# Patient Record
Sex: Male | Born: 1970 | Race: White | Hispanic: No | Smoking: Former smoker
Health system: Southern US, Community
[De-identification: ages and names within clinical notes are randomized; demographics above are authoritative.]

## PROBLEM LIST (undated history)

## (undated) DIAGNOSIS — J449 Chronic obstructive pulmonary disease, unspecified: Secondary | ICD-10-CM

## (undated) DIAGNOSIS — J45909 Unspecified asthma, uncomplicated: Secondary | ICD-10-CM

## (undated) HISTORY — PX: PLEURAL SCARIFICATION: SHX748

## (undated) HISTORY — PX: OTHER SURGICAL HISTORY: SHX169

---

## 2003-04-14 ENCOUNTER — Emergency Department (HOSPITAL_COMMUNITY): Admission: EM | Admit: 2003-04-14 | Discharge: 2003-04-14 | Payer: Self-pay | Admitting: *Deleted

## 2003-04-15 ENCOUNTER — Ambulatory Visit (HOSPITAL_COMMUNITY): Admission: RE | Admit: 2003-04-15 | Discharge: 2003-04-15 | Payer: Self-pay | Admitting: General Surgery

## 2003-04-15 ENCOUNTER — Encounter: Payer: Self-pay | Admitting: General Surgery

## 2003-04-25 ENCOUNTER — Emergency Department (HOSPITAL_COMMUNITY): Admission: EM | Admit: 2003-04-25 | Discharge: 2003-04-25 | Payer: Self-pay | Admitting: Emergency Medicine

## 2003-05-05 ENCOUNTER — Emergency Department (HOSPITAL_COMMUNITY): Admission: EM | Admit: 2003-05-05 | Discharge: 2003-05-05 | Payer: Self-pay | Admitting: Emergency Medicine

## 2005-08-28 ENCOUNTER — Emergency Department (HOSPITAL_COMMUNITY): Admission: EM | Admit: 2005-08-28 | Discharge: 2005-08-28 | Payer: Self-pay | Admitting: Emergency Medicine

## 2019-01-29 ENCOUNTER — Emergency Department (HOSPITAL_COMMUNITY): Payer: Managed Care, Other (non HMO)

## 2019-01-29 ENCOUNTER — Encounter (HOSPITAL_COMMUNITY): Payer: Self-pay | Admitting: Student

## 2019-01-29 ENCOUNTER — Emergency Department (HOSPITAL_COMMUNITY)
Admission: EM | Admit: 2019-01-29 | Discharge: 2019-01-29 | Disposition: A | Discharge status: Home / Self Care | Payer: Managed Care, Other (non HMO) | Attending: Emergency Medicine | Admitting: Emergency Medicine

## 2019-01-29 ENCOUNTER — Other Ambulatory Visit: Payer: Self-pay

## 2019-01-29 DIAGNOSIS — J449 Chronic obstructive pulmonary disease, unspecified: Secondary | ICD-10-CM | POA: Insufficient documentation

## 2019-01-29 DIAGNOSIS — R1031 Right lower quadrant pain: Secondary | ICD-10-CM | POA: Diagnosis present

## 2019-01-29 DIAGNOSIS — N200 Calculus of kidney: Secondary | ICD-10-CM | POA: Insufficient documentation

## 2019-01-29 LAB — URINALYSIS, ROUTINE W REFLEX MICROSCOPIC
Bilirubin Urine: NEGATIVE
Glucose, UA: NEGATIVE mg/dL
Ketones, ur: NEGATIVE mg/dL
Leukocytes,Ua: NEGATIVE
Nitrite: NEGATIVE
Protein, ur: NEGATIVE mg/dL
Specific Gravity, Urine: 1.005 (ref 1.005–1.030)
pH: 8 (ref 5.0–8.0)

## 2019-01-29 LAB — CBC
HCT: 39.5 % (ref 39.0–52.0)
Hemoglobin: 12.5 g/dL — ABNORMAL LOW (ref 13.0–17.0)
MCH: 26.5 pg (ref 26.0–34.0)
MCHC: 31.6 g/dL (ref 30.0–36.0)
MCV: 83.9 fL (ref 80.0–100.0)
Platelets: 191 10*3/uL (ref 150–400)
RBC: 4.71 MIL/uL (ref 4.22–5.81)
RDW: 14.6 % (ref 11.5–15.5)
WBC: 5.1 10*3/uL (ref 4.0–10.5)
nRBC: 0 % (ref 0.0–0.2)

## 2019-01-29 LAB — COMPREHENSIVE METABOLIC PANEL
ALT: 15 U/L (ref 0–44)
AST: 17 U/L (ref 15–41)
Albumin: 4.4 g/dL (ref 3.5–5.0)
Alkaline Phosphatase: 112 U/L (ref 38–126)
Anion gap: 5 (ref 5–15)
BUN: 12 mg/dL (ref 6–20)
CO2: 24 mmol/L (ref 22–32)
Calcium: 9.3 mg/dL (ref 8.9–10.3)
Chloride: 110 mmol/L (ref 98–111)
Creatinine, Ser: 1.09 mg/dL (ref 0.61–1.24)
GFR calc Af Amer: >60 mL/min (ref >60)
GFR calc non Af Amer: >60 mL/min (ref >60)
Glucose, Bld: 98 mg/dL (ref 70–99)
Potassium: 3.5 mmol/L (ref 3.5–5.1)
Sodium: 139 mmol/L (ref 135–145)
Total Bilirubin: 0.5 mg/dL (ref 0.3–1.2)
Total Protein: 7.3 g/dL (ref 6.5–8.1)

## 2019-01-29 MED ORDER — TAMSULOSIN HCL 0.4 MG PO CAPS: 0.4000 mg | ORAL_CAPSULE | Freq: Every day | ORAL | 0 refills | Status: DC

## 2019-01-29 MED ORDER — ONDANSETRON HCL 4 MG/2ML IJ SOLN
4.0000 mg | Freq: Once | INTRAMUSCULAR | Status: AC
  Administered 2019-01-29: 10:00:00 4 mg via INTRAVENOUS
  Filled 2019-01-29: qty 2

## 2019-01-29 MED ORDER — SODIUM CHLORIDE 0.9 % IV BOLUS
500.0000 mL | Freq: Once | INTRAVENOUS | Status: AC
  Administered 2019-01-29: 10:00:00 500 mL via INTRAVENOUS

## 2019-01-29 MED ORDER — ONDANSETRON 4 MG PO TBDP: 4.0000 mg | ORAL_TABLET | Freq: Three times a day (TID) | ORAL | 0 refills | Status: DC | PRN

## 2019-01-29 MED ORDER — MORPHINE SULFATE (PF) 4 MG/ML IV SOLN
4.0000 mg | Freq: Once | INTRAVENOUS | Status: AC
  Administered 2019-01-29: 10:00:00 4 mg via INTRAVENOUS
  Filled 2019-01-29: qty 1

## 2019-01-29 MED ORDER — IOHEXOL 300 MG/ML  SOLN
100.0000 mL | Freq: Once | INTRAMUSCULAR | Status: AC | PRN
  Administered 2019-01-29: 100 mL via INTRAVENOUS

## 2019-01-29 MED ORDER — CEPHALEXIN 500 MG PO CAPS: 500.0000 mg | ORAL_CAPSULE | Freq: Four times a day (QID) | ORAL | 0 refills | Status: DC

## 2019-01-29 MED ORDER — CEPHALEXIN 500 MG PO CAPS
500.0000 mg | ORAL_CAPSULE | Freq: Once | ORAL | Status: AC
  Administered 2019-01-29: 500 mg via ORAL
  Filled 2019-01-29: qty 1

## 2019-01-29 MED ORDER — OXYCODONE-ACETAMINOPHEN 5-325 MG PO TABS: 1.0000 | ORAL_TABLET | Freq: Four times a day (QID) | ORAL | 0 refills | Status: DC | PRN

## 2019-01-29 MED ORDER — SODIUM CHLORIDE (PF) 0.9 % IJ SOLN
INTRAMUSCULAR | Status: AC
  Filled 2019-01-29: qty 50

## 2019-01-29 MED ORDER — KETOROLAC TROMETHAMINE 15 MG/ML IJ SOLN
15.0000 mg | Freq: Once | INTRAMUSCULAR | Status: AC
  Administered 2019-01-29: 15 mg via INTRAVENOUS
  Filled 2019-01-29: qty 1

## 2019-01-29 MED ORDER — IBUPROFEN 800 MG PO TABS: 800.0000 mg | ORAL_TABLET | Freq: Three times a day (TID) | ORAL | 0 refills | Status: AC

## 2019-01-29 NOTE — ED Notes (Signed)
Bed: QB34 Expected date:  Expected time:  Means of arrival:  Comments: EMS 47yo abd pain

## 2019-01-29 NOTE — ED Triage Notes (Signed)
Patient BIB EMS from work with complaints of RLQ abdominal pain starting this morning. Patient has history of kidney stones that have been small and easy to pass. Patient reports this pain started in his RLQ abdomen and has moved down toward his bladder. Patient has history of hypotension, and takes medications to keep his BP up.  EMS VS: HR 73, RR22, SPO2 98%, 97.43F, 138 palp.

## 2019-01-29 NOTE — ED Provider Notes (Signed)
[facility]-EMERGENCY DEPT Provider Note   CSN: [id number] Arrival date & time: 01/29/19  0906  History   Chief Complaint Chief Complaint  Patient presents with   Abdominal Pain    HPI Bobby Brown is a 48 y.o. male with a hx of nephrolithiasis, COPD, & prior R lower lobectomy who presents to the ED via EMS with complaints of abdominal pain that began @ 0600 this AM. Patient describes the pain as a waxing & waning dull sensation to the RLQ of the abdomen radiating into the bladder area. Current pain 7/10 in severity, no alleviating/aggravating factors. NO intervention PTA. Reports associated nausea & urge to urinate but inability to do so. Last able to urinate last night. Denies fever, chills, emesis, testicular pain/swelling, dysuria, hematuria, diarrhea, or melena. Last BM Was last night, had to strain some. No prior abdominal surgeries. Hx of nephrolithiasis but typically has flank pain, this does not necessarily feel the same.      HPI  History reviewed. No pertinent past medical history.  There are no active problems to display for this patient.   History reviewed. No pertinent surgical history.    Home Medications    Prior to Admission medications   Not on File    Family History History reviewed. No pertinent family history.  Social History Social History   Tobacco Use   Smoking status: Not on file  Substance Use Topics   Alcohol use: Not on file   Drug use: Not on file     Allergies   Patient has no known allergies.   Review of Systems Review of Systems  Constitutional: Negative for chills and fever.  Respiratory: Negative for shortness of breath.   Cardiovascular: Negative for chest pain.  Gastrointestinal: Positive for abdominal pain and nausea. Negative for anal bleeding, blood in stool, diarrhea and vomiting.  Genitourinary: Positive for difficulty urinating and urgency. Negative for dysuria, flank pain, hematuria, scrotal  swelling and testicular pain.  All other systems reviewed and are negative.    Physical Exam Updated Vital Signs BP 122/83    Pulse (!) 44    Temp 98 F (36.7 C) (Oral)    Resp 18    SpO2 98%    Physical Exam Vitals signs and nursing note reviewed.  Constitutional:      General: He is in acute distress (mild, appears uncomfortable).     Appearance: He is well-developed. He is not toxic-appearing.  HENT:     Head: Normocephalic and atraumatic.  Eyes:     General:        Right eye: No discharge.        Left eye: No discharge.     Conjunctiva/sclera: Conjunctivae normal.  Neck:     Musculoskeletal: Neck supple.  Cardiovascular:     Rate and Rhythm: Normal rate and regular rhythm.  Pulmonary:     Effort: Pulmonary effort is normal. No respiratory distress.     Breath sounds: Normal breath sounds. No wheezing, rhonchi or rales.  Abdominal:     General: There is no distension.     Palpations: Abdomen is soft.     Tenderness: There is abdominal tenderness in the right lower quadrant. There is no right CVA tenderness or left CVA tenderness.  Skin:    General: Skin is warm and dry.     Findings: No rash.  Neurological:     Mental Status: He is alert.     Comments: Clear speech.   Psychiatric:  Behavior: Behavior normal.    ED Treatments / Results  Labs (all labs ordered are listed, but only abnormal results are displayed) Labs Reviewed  CBC - Abnormal; Notable for the following components:      Result Value   Hemoglobin 12.5 (*)    All other components within normal limits  URINALYSIS, ROUTINE W REFLEX MICROSCOPIC - Abnormal; Notable for the following components:   Hgb urine dipstick LARGE (*)    Bacteria, UA MANY (*)    All other components within normal limits  URINE CULTURE  COMPREHENSIVE METABOLIC PANEL    EKG None  Radiology Ct Abdomen Pelvis W Contrast  Result Date: 01/29/2019 CLINICAL DATA:  Right lower quadrant region pain EXAM: CT ABDOMEN AND  PELVIS WITH CONTRAST TECHNIQUE: Multidetector CT imaging of the abdomen and pelvis was performed using the standard protocol following bolus administration of intravenous contrast. CONTRAST:  100mL OMNIPAQUE IOHEXOL 300 MG/ML  SOLN COMPARISON:  None. FINDINGS: Lower chest: There is scarring in the lung bases bilaterally, slightly more on the right than on the left. No lung base edema or consolidation evident. Hepatobiliary: There is evident hepatic steatosis. No focal liver lesions are demonstrable. The gallbladder wall is not appreciably thickened. There is no biliary duct dilatation. Pancreas: There is no evident pancreatic mass or inflammatory focus. Spleen: No splenic lesions are evident. Adrenals/Urinary Tract: Adrenals bilaterally appear unremarkable. There is no appreciable renal mass on either side. There is slight hydronephrosis on the right. There is no appreciable hydronephrosis on the left. No intrarenal calculi identified on either side. There is mild soft tissue stranding along the proximal right ureter with mild enhancement of the wall of the proximal right ureter. There is a 4 x 3 mm calculus at the right ureterovesical junction. No other ureteral calculi are evident on either side. Urinary bladder is midline with wall thickness within normal limits. Stomach/Bowel: There is no appreciable bowel wall or mesenteric thickening. There is no evident bowel obstruction. Terminal ileum appears unremarkable. There is no appreciable free air or portal venous air. Vascular/Lymphatic: There is no abdominal aortic aneurysm. There is minimal atherosclerotic calcification in the aorta. There is no evident adenopathy in the abdomen or pelvis. Reproductive: There are prostatic calculi. Prostate and seminal vesicles are normal in size and contour. No evident pelvic mass. Other: Appendix appears normal. There is no abscess or ascites in the abdomen or pelvis. Musculoskeletal: There are no blastic or lytic bone lesions.  There is no intramuscular or abdominal wall lesion. IMPRESSION: 1. 4 x 3 mm calculus at the right ureterovesical junction. There is slight hydronephrosis on the right. There is mild inflammatory change involving the proximal right ureter. 2. Appendix appears normal. No bowel obstruction. No abscess in the abdomen or pelvis. 3.  There are several prostatic calculi. 4.  Scarring in the lung bases. 5.  Hepatic steatosis present. Electronically Signed   By: Bretta BangWilliam  Woodruff III M.D.   On: 01/29/2019 11:31    Procedures Procedures (including critical care time)  Medications Ordered in ED Medications  morphine 4 MG/ML injection 4 mg (has no administration in time range)  ondansetron (ZOFRAN) injection 4 mg (has no administration in time range)  sodium chloride 0.9 % bolus 500 mL (has no administration in time range)     Initial Impression / Assessment and Plan / ED Course  I have reviewed the triage vital signs and the nursing notes.  Pertinent labs & imaging results that were available during my care of the patient  were reviewed by me and considered in my medical decision making (see chart for details).    Patient presents to the ED with complaints of RLQ abdominal pain. Patient nontoxic appearing, appears mildly uncomfortable, vitals notable for intermittent bradycardia, hypertension- doubt HTN emergency. afebrile. On exam patient is tender RLQ of the abdomen, no CVA tenderness. DDX: nephrolithiasis, pyelonephritis/UTI, cholecystitis, pancreatitis, bowel obstruction/perforation, appendicitis, dissection, feel nephrolithiasis is most likely at this time, but given his RLQ tenderness and that this does not feel like his prior stones will evaluate w/ CT abdomen/pelvis w/ contrast, additionally w/ labs. Analgesics, anti-emetics, and fluids ordered. He reports some difficulty urinating- bladder scan w/ 135-145 of mL, ultimately was able to urinate on his own.   No leukocytosis. Mild anemia- PCP recheck,  none on record for comparison. No significant electrolyte derangements. LFTs WNL.  CT w/ 4 x 3 mm calculus @ the R ureterovesical junction w/ slight hydro & mild inflammatory changes to proximal ureter to R side. Appendix is normal. No abscess. No obstruction. Incidental findings as above. Renal function preserved. Urinalysis with many bacteria-- will discuss w/ urology.   Discussed w/ urologist Dr. Sande Brothers- recommends culture & Keflex for 7 days.   Patient tolerating PO in the ER with pain well controlled. Will discharge home with Keflex, Flomax, Zofran, NSAID, and Percocet with urology follow up. [place] Controlled Substance reporting System queried. I discussed results, treatment plan, need for urology follow-up, and return precautions with the patient. Provided opportunity for questions, patient confirmed understanding and is in agreement with plan.   Findings and plan of care discussed with supervising physician Dr. Rubin Payor who is in agreement.   Final Clinical Impressions(s) / ED Diagnoses   Final diagnoses:  Kidney stone    ED Discharge Orders         Ordered    ibuprofen (ADVIL) 800 MG tablet  3 times daily     01/29/19 1403    oxyCODONE-acetaminophen (PERCOCET/ROXICET) 5-325 MG tablet  Every 6 hours PRN     01/29/19 1403    ondansetron (ZOFRAN ODT) 4 MG disintegrating tablet  Every 8 hours PRN     01/29/19 1403    tamsulosin (FLOMAX) 0.4 MG CAPS capsule  Daily after supper     01/29/19 1403    cephALEXin (KEFLEX) 500 MG capsule  4 times daily     01/29/19 1403           Clarivel Callaway, Pleas Koch, PA-C 01/29/19 1416    Benjiman Core, MD 01/30/19 1512

## 2019-01-29 NOTE — ED Notes (Signed)
Pt. Bladder scan was measured x2 being that the first one was reading at and second one was . Nurse aware.

## 2019-01-29 NOTE — ED Notes (Signed)
ED PA at bedside

## 2019-01-29 NOTE — Discharge Instructions (Addendum)
You were seen in the emergency department and found to have a kidney stone.  We are sending you home with multiple medications to assist with passing the stone:   -Flomax-this is a medication to help pass the stone, it allows urine to exit the body more freely.  Please take this once daily with a meal.  -Ibuprofen 800 mg-this is a medication that will help with pain as well as passing the stone.  Please take this every 8 hours.  Take this with food as it can cause stomach upset and at worst stomach bleeding.  Do not take other NSAIDs such as Motrin, Aleve, Advil, Mobic, or Naproxen with this medicine as they are similar and would propagate any potential side effects.   -Percocet-this is a narcotic/controlled substance medication that has potential addicting qualities.  We recommend that you take 1-2 tablets every 6 hours as needed for severe pain.  Do not drive or operate heavy machinery when taking this medicine as it can be sedating. Do not drink alcohol or take other sedating medications when taking this medicine for safety reasons.  Keep this out of reach of small children.  Please be aware this medicine has Tylenol in it (325 mg/tab) do not exceed the maximum dose of Tylenol in a day per over the counter recommendations should you decide to supplement with Tylenol over the counter.   -Zofran-this is an antinausea medication, you may take this every 8 hours as needed for nausea and vomiting, please allow the tablet to dissolve underneath of your tongue.   - Keflex-this is an antibiotic to treat possible infection as there was bacteria in your urine.  We have prescribed you new medication(s) today. Discuss the medications prescribed today with your pharmacist as they can have adverse effects and interactions with your other medicines including over the counter and prescribed medications. Seek medical evaluation if you start to experience new or abnormal symptoms after taking one of these medicines,  seek care immediately if you start to experience difficulty breathing, feeling of your throat closing, facial swelling, or rash as these could be indications of a more serious allergic reaction  Your CT scan showed some fatty liver changes and that you have some stones in the prostate. Discussed the liver changes with primary care and the prostate changes with urology please.   Please follow-up with the urology group provided in your discharge instructions within 3 to 5 days.  Return to the ER for new or worsening symptoms including but not limited to worsening pain not controlled by these medicines, inability to keep fluids down, fever, or any other concerns that you may have.

## 2019-01-31 LAB — URINE CULTURE: Culture: NO GROWTH

## 2019-03-16 ENCOUNTER — Encounter (HOSPITAL_COMMUNITY): Payer: Self-pay | Admitting: Emergency Medicine

## 2019-03-16 ENCOUNTER — Emergency Department (HOSPITAL_COMMUNITY)
Admission: EM | Admit: 2019-03-16 | Discharge: 2019-03-16 | Disposition: A | Discharge status: Home / Self Care | Payer: Managed Care, Other (non HMO) | Attending: Emergency Medicine | Admitting: Emergency Medicine

## 2019-03-16 ENCOUNTER — Emergency Department (HOSPITAL_COMMUNITY): Payer: Managed Care, Other (non HMO)

## 2019-03-16 ENCOUNTER — Other Ambulatory Visit: Payer: Self-pay

## 2019-03-16 DIAGNOSIS — Y929 Unspecified place or not applicable: Secondary | ICD-10-CM | POA: Diagnosis not present

## 2019-03-16 DIAGNOSIS — Y999 Unspecified external cause status: Secondary | ICD-10-CM | POA: Insufficient documentation

## 2019-03-16 DIAGNOSIS — W25XXXA Contact with sharp glass, initial encounter: Secondary | ICD-10-CM | POA: Insufficient documentation

## 2019-03-16 DIAGNOSIS — Z79899 Other long term (current) drug therapy: Secondary | ICD-10-CM | POA: Diagnosis not present

## 2019-03-16 DIAGNOSIS — Z20828 Contact with and (suspected) exposure to other viral communicable diseases: Secondary | ICD-10-CM | POA: Diagnosis not present

## 2019-03-16 DIAGNOSIS — F17290 Nicotine dependence, other tobacco product, uncomplicated: Secondary | ICD-10-CM | POA: Insufficient documentation

## 2019-03-16 DIAGNOSIS — Y9389 Activity, other specified: Secondary | ICD-10-CM | POA: Diagnosis not present

## 2019-03-16 DIAGNOSIS — S51812A Laceration without foreign body of left forearm, initial encounter: Secondary | ICD-10-CM

## 2019-03-16 DIAGNOSIS — S56922A Laceration of unspecified muscles, fascia and tendons at forearm level, left arm, initial encounter: Secondary | ICD-10-CM | POA: Diagnosis not present

## 2019-03-16 DIAGNOSIS — J45909 Unspecified asthma, uncomplicated: Secondary | ICD-10-CM | POA: Insufficient documentation

## 2019-03-16 DIAGNOSIS — S59912A Unspecified injury of left forearm, initial encounter: Secondary | ICD-10-CM | POA: Diagnosis present

## 2019-03-16 HISTORY — DX: Unspecified asthma, uncomplicated: J45.909

## 2019-03-16 HISTORY — DX: Chronic obstructive pulmonary disease, unspecified: J44.9

## 2019-03-16 LAB — SARS CORONAVIRUS 2 BY RT PCR (HOSPITAL ORDER, PERFORMED IN ~~LOC~~ HOSPITAL LAB): SARS Coronavirus 2: NEGATIVE

## 2019-03-16 MED ORDER — HYDROCODONE-ACETAMINOPHEN 5-325 MG PO TABS
1.0000 | ORAL_TABLET | Freq: Once | ORAL | Status: AC
  Administered 2019-03-16: 1 via ORAL
  Filled 2019-03-16: qty 1

## 2019-03-16 MED ORDER — LIDOCAINE-EPINEPHRINE (PF) 2 %-1:200000 IJ SOLN
10.0000 mL | Freq: Once | INTRAMUSCULAR | Status: AC
  Administered 2019-03-16: 10 mL
  Filled 2019-03-16: qty 20

## 2019-03-16 MED ORDER — MORPHINE SULFATE (PF) 4 MG/ML IV SOLN
4.0000 mg | Freq: Once | INTRAVENOUS | Status: AC
  Administered 2019-03-16: 4 mg via INTRAVENOUS
  Filled 2019-03-16: qty 1

## 2019-03-16 NOTE — ED Notes (Signed)
DSD applied.  Ortho tech in to apply short arm splint

## 2019-03-16 NOTE — ED Notes (Signed)
Pt returned from xray

## 2019-03-16 NOTE — ED Triage Notes (Addendum)
Pt to ED via [facility] after reported working on a car and a broken window fell on his left wrist.  EMS st's pt has avulsion type laceration to left wrist.  Bandage in place, bleeding controlled at this time.  Pt was given Fentanyl 279mcg IV PTA

## 2019-03-16 NOTE — ED Provider Notes (Signed)
[facility] EMERGENCY DEPARTMENT Provider Note   CSN: [id number] Arrival date & time: 03/16/19  Bobby Brown     History   Chief Complaint Chief Complaint  Patient presents with  . Laceration    HPI Bobby Brown is a 48 y.o. male.     The history is provided by the patient.  Laceration Location:  Shoulder/arm Shoulder/arm laceration location:  L forearm Length:  5cm Depth:  Through underlying tissue Quality: jagged   Bleeding: venous and controlled with pressure   Time since incident:  1 hour Laceration mechanism:  Broken glass Pain details:    Quality:  Throbbing, squeezing and shooting   Severity:  Moderate   Timing:  Constant   Progression:  Unchanged Foreign body present:  Unable to specify Relieved by: being still. Worsened by:  Movement Ineffective treatments:  Certain positions Associated symptoms: no numbness and no redness     Past Medical History:  Diagnosis Date  . Asthma   . COPD (chronic obstructive pulmonary disease) (HCC)     There are no active problems to display for this patient.   Past Surgical History:  Procedure Laterality Date  . PLEURAL SCARIFICATION    . right lobectomy          Home Medications    Prior to Admission medications   Medication Sig Start Date End Date Taking? Authorizing Provider  buPROPion (WELLBUTRIN SR) 150 MG 12 hr tablet Take 150 mg by mouth at bedtime.  11/19/18   [provider]  busPIRone (BUSPAR) 5 MG tablet Take 5 mg by mouth at bedtime.  11/30/18   [provider]  cephALEXin (KEFLEX) 500 MG capsule Take 1 capsule (500 mg total) by mouth 4 (four) times daily. 01/29/19   Petrucelli, Samantha R, PA-C  dicyclomine (BENTYL) 20 MG tablet Take 20 mg by mouth at bedtime.     [provider]  fludrocortisone (FLORINEF) 0.1 MG tablet Take 0.1 mg by mouth at bedtime.  11/16/18   [provider]  ibuprofen (ADVIL) 800 MG tablet Take 1 tablet (800 mg total) by mouth 3  (three) times daily. 01/29/19   Petrucelli, Samantha R, PA-C  ondansetron (ZOFRAN ODT) 4 MG disintegrating tablet Take 1 tablet (4 mg total) by mouth every 8 (eight) hours as needed for nausea or vomiting. 01/29/19   Petrucelli, Samantha R, PA-C  oxyCODONE-acetaminophen (PERCOCET/ROXICET) 5-325 MG tablet Take 1-2 tablets by mouth every 6 (six) hours as needed for severe pain. 01/29/19   Petrucelli, Samantha R, PA-C  oxymetazoline (AFRIN) 0.05 % nasal spray Place 2 sprays into both nostrils as needed for congestion.     [provider]  SUMAtriptan (IMITREX) 100 MG tablet Take 100 mg by mouth every 2 (two) hours as needed for migraine or headache. Maximum dose 200mg  in 24 hours. 11/08/17   [provider]  tamsulosin (FLOMAX) 0.4 MG CAPS capsule Take 1 capsule (0.4 mg total) by mouth daily after supper. 01/29/19   Petrucelli, Samantha R, PA-C  traZODone (DESYREL) 50 MG tablet Take 50 mg by mouth at bedtime. 11/19/18   [provider]    Family History No family history on file.  Social History Social History   Tobacco Use  . Smoking status: Former Research scientist (life sciences)  . Smokeless tobacco: Current User  Substance Use Topics  . Alcohol use: Yes    Comment: rarely  . Drug use: Never     Allergies   Aspirin   Review of Systems Review of Systems  All other systems reviewed and are negative.    Physical Exam Updated Vital Signs BP (!) 136/94 (BP Location: Right Arm)   Pulse 80   Temp 98.3 F (36.8 C) (Oral)   Resp 16   Ht 5\' 9"  (1.753 m)   Wt 64.4 kg   SpO2 96%   BMI 20.97 kg/m   Physical Exam Constitutional:      General: He is not in acute distress.    Appearance: Normal appearance. He is normal weight.  HENT:     Head: Normocephalic.  Eyes:     Pupils: Pupils are equal, round, and reactive to light.  Cardiovascular:     Rate and Rhythm: Normal rate.  Pulmonary:     Effort: Pulmonary effort is normal.  Musculoskeletal:        General: Tenderness and signs  of injury present.       Arms:  Skin:    General: Skin is warm and dry.     Capillary Refill: Capillary refill takes less than 2 seconds.  Neurological:     Mental Status: He is alert.     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        Behavior: Behavior normal.        Thought Content: Thought content normal.      ED Treatments / Results  Labs (all labs ordered are listed, but only abnormal results are displayed) Labs Reviewed - No data to display  EKG None  Radiology Dg Forearm Left  Result Date: 03/16/2019 CLINICAL DATA:  48 year old male with laceration of the left forearm. EXAM: LEFT FOREARM - 2 VIEW COMPARISON:  Left wrist radiograph dated 11/02/2013 FINDINGS: There is no acute fracture or dislocation. The bones are well mineralized. Degenerative changes of the scaphotrapezoid. Laceration of the volar aspect of the distal forearm. No radiopaque foreign object. IMPRESSION: 1. No acute/traumatic osseous pathology. 2. No radiopaque foreign body. Electronically Signed   By: Elgie CollardArash  Radparvar M.D.   On: 03/16/2019 20:01    Procedures Procedures (including critical care time)  LACERATION REPAIR Performed by: [employer] RxWhitney Keyonia Gluth Authorized by: Gwyneth SproutWhitney Pamula Luther Consent: Verbal consent obtained. Risks and benefits: risks, benefits and alternatives were discussed Consent given by: patient Patient identity confirmed: provided demographic data Prepped and Draped in normal sterile fashion Wound explored  Laceration Location: left forearm  Laceration Length: 5cm  No Foreign Bodies seen or palpated  Anesthesia: local infiltration  Local anesthetic: lidocaine 1% with epinephrine  Anesthetic total: 4 ml  Irrigation method: syringe Amount of cleaning: standard  Skin closure: 4.0 prolene  Number of sutures: 5  Technique: simple interrupted  Patient tolerance: Patient  tolerated the procedure well with no immediate complications.   Medications Ordered in ED Medications  morphine 4 MG/ML injection 4 mg (has no administration in time range)     Initial Impression / Assessment and Plan / ED Course  I have reviewed the triage vital signs and the nursing notes.  Pertinent labs & imaging results that were available during my care of the patient were reviewed by me and considered in my medical decision making (see chart for details).        Patient is a right-handed 48 year old male presenting today with a laceration over the left wrist with visible signs of tendon injury.  Patient has weak wrist flexion and finger extension but normal sensation.  No arterial injury.  He was cut by broken glass from a car door while he was changing it and the door slipped it cut him.  X-ray to evaluate for retained foreign body.  Will discuss with hand surgery due to multiple tendon injury based on exam suspect palmaris longus, flexor carpi radialis and possible an additional deeper tendon.  Tetanus is UTD.  8:30 PM There is no glass in the wound and x-ray is otherwise negative.  Spoke with Dr. Izora Ribasoley who recommended closing the wound loosely and he will plan on doing surgery tomorrow morning.  Patient was splinted and given pain control.  He was instructed to not eat or drink anything after midnight.  Final Clinical Impressions(s) / ED Diagnoses   Final diagnoses:  Forearm laceration involving tendon, left, initial encounter    ED Discharge Orders    None       Gwyneth SproutPlunkett, Calib Wadhwa, MD 03/16/19 2125

## 2019-03-16 NOTE — Progress Notes (Signed)
Orthopedic Tech Progress Note Patient Details:  Bobby Brown May 23, 1971 [id number]  Ortho Devices Type of Ortho Device: Arm sling, Short arm splint Ortho Device/Splint Location: applied dorsal splint lue tip of fingers to mid forearm at drs request. Ortho Device/Splint Interventions: Ordered, Application, Adjustment   Post Interventions Patient Tolerated: Well Instructions Provided: Care of device, Adjustment of device   Karolee Stamps 03/16/2019, 10:44 PM

## 2019-03-16 NOTE — Discharge Instructions (Signed)
You have a large laceration of your left wrist with tendon injury today.  Leave the splint and dressing in place until you see the hand surgeon Dr. Lenon Curt tomorrow.  You need to call his office at [phone] and they will tell you where you need to go to have surgery.  Try to elevate your hand tonight.

## 2019-03-16 NOTE — ED Notes (Signed)
Pt to xray at this time.

## 2019-09-28 ENCOUNTER — Other Ambulatory Visit: Payer: Self-pay

## 2019-09-28 ENCOUNTER — Emergency Department (HOSPITAL_COMMUNITY): Payer: Managed Care, Other (non HMO)

## 2019-09-28 ENCOUNTER — Emergency Department (HOSPITAL_COMMUNITY)
Admission: EM | Admit: 2019-09-28 | Discharge: 2019-09-28 | Disposition: A | Discharge status: Home / Self Care | Payer: Managed Care, Other (non HMO) | Attending: Emergency Medicine | Admitting: Emergency Medicine

## 2019-09-28 ENCOUNTER — Encounter (HOSPITAL_COMMUNITY): Payer: Self-pay

## 2019-09-28 DIAGNOSIS — Z79899 Other long term (current) drug therapy: Secondary | ICD-10-CM | POA: Insufficient documentation

## 2019-09-28 DIAGNOSIS — J449 Chronic obstructive pulmonary disease, unspecified: Secondary | ICD-10-CM | POA: Insufficient documentation

## 2019-09-28 DIAGNOSIS — R0602 Shortness of breath: Secondary | ICD-10-CM | POA: Insufficient documentation

## 2019-09-28 DIAGNOSIS — R0781 Pleurodynia: Secondary | ICD-10-CM | POA: Diagnosis not present

## 2019-09-28 DIAGNOSIS — F17228 Nicotine dependence, chewing tobacco, with other nicotine-induced disorders: Secondary | ICD-10-CM | POA: Diagnosis not present

## 2019-09-28 DIAGNOSIS — M25512 Pain in left shoulder: Secondary | ICD-10-CM | POA: Diagnosis not present

## 2019-09-28 DIAGNOSIS — R079 Chest pain, unspecified: Secondary | ICD-10-CM | POA: Diagnosis present

## 2019-09-28 MED ORDER — IBUPROFEN 200 MG PO TABS
600.0000 mg | ORAL_TABLET | Freq: Once | ORAL | Status: AC
  Administered 2019-09-28: 600 mg via ORAL
  Filled 2019-09-28: qty 3

## 2019-09-28 MED ORDER — OXYCODONE-ACETAMINOPHEN 5-325 MG PO TABS
1.0000 | ORAL_TABLET | Freq: Once | ORAL | Status: AC
  Administered 2019-09-28: 1 via ORAL
  Filled 2019-09-28: qty 1

## 2019-09-28 NOTE — ED Triage Notes (Signed)
Pt BIB EMS from home, pt lives with girlfriend. Pt c/o SOB, left shoulder pain starting at 0800 today and getting progressively worse. Pt has hx of collapsed lung, anxiety. EMS reports left side diminished lung sounds.   146/90 64 HR 100% RA 18 Resp 97.9 temp

## 2019-09-28 NOTE — ED Notes (Signed)
An After Visit Summary was printed and given to the patient. Discharge instructions given and no further questions at this time. Pt denies SOB at this time.  

## 2019-09-28 NOTE — ED Provider Notes (Signed)
[facility] DEPT Provider Note   CSN: [id number] Arrival date & time: 09/28/19  1930     History Chief Complaint  Patient presents with  . Shortness of Breath  . Left Shoulder Pain    Bobby Brown is a 49 y.o. male.  HPI   49 year old male with chest pain.  Patient reports sharp left-sided chest pain and towards his left scapula.  Onset around 8:00 today.  Pain is worse with inspiration and certain movements.  Mild shortness of breath.  No coughing.  No fevers or chills.  No unusual leg pain or swelling.  He reports a past history of spontaneous pneumothoraces with prior blebectomy and pleurodesis.  Past Medical History:  Diagnosis Date  . Asthma   . COPD (chronic obstructive pulmonary disease) (HCC)    There are no problems to display for this patient.  Past Surgical History:  Procedure Laterality Date  . PLEURAL SCARIFICATION    . right lobectomy       History reviewed. No pertinent family history.  Social History   Tobacco Use  . Smoking status: Former Research scientist (life sciences)  . Smokeless tobacco: Current User  Substance Use Topics  . Alcohol use: Yes    Comment: rarely  . Drug use: Never   Home Medications Prior to Admission medications   Medication Sig Start Date End Date Taking? Authorizing Provider  buPROPion (WELLBUTRIN SR) 150 MG 12 hr tablet Take 150 mg by mouth at bedtime.  11/19/18  Yes [provider]  busPIRone (BUSPAR) 5 MG tablet Take 5 mg by mouth at bedtime.  11/30/18  Yes [provider]  dicyclomine (BENTYL) 20 MG tablet Take 20 mg by mouth at bedtime.    Yes [provider]  ibuprofen (ADVIL) 800 MG tablet Take 1 tablet (800 mg total) by mouth 3 (three) times daily. Patient taking differently: Take 800 mg by mouth every 6 (six) hours as needed for fever or moderate pain.  01/29/19  Yes Petrucelli, Samantha R, PA-C  oxymetazoline (AFRIN) 0.05 % nasal spray Place 2 sprays into both nostrils as needed for  congestion.    Yes [provider]  SUMAtriptan (IMITREX) 100 MG tablet Take 100 mg by mouth every 2 (two) hours as needed for migraine or headache. Maximum dose 200mg  in 24 hours. 11/08/17  Yes [provider]  traZODone (DESYREL) 50 MG tablet Take 50 mg by mouth at bedtime. 11/19/18  Yes [provider]  cephALEXin (KEFLEX) 500 MG capsule Take 1 capsule (500 mg total) by mouth 4 (four) times daily. Patient not taking: Reported on 09/28/2019 01/29/19   Petrucelli, Samantha R, PA-C  ondansetron (ZOFRAN ODT) 4 MG disintegrating tablet Take 1 tablet (4 mg total) by mouth every 8 (eight) hours as needed for nausea or vomiting. Patient not taking: Reported on 09/28/2019 01/29/19   Petrucelli, Glynda Jaeger, PA-C  oxyCODONE-acetaminophen (PERCOCET/ROXICET) 5-325 MG tablet Take 1-2 tablets by mouth every 6 (six) hours as needed for severe pain. Patient not taking: Reported on 09/28/2019 01/29/19   Petrucelli, Glynda Jaeger, PA-C  tamsulosin (FLOMAX) 0.4 MG CAPS capsule Take 1 capsule (0.4 mg total) by mouth daily after supper. Patient not taking: Reported on 09/28/2019 01/29/19   Petrucelli, Glynda Jaeger, PA-C   Allergies    Aspirin  Review of Systems   Review of Systems All systems reviewed and negative, other than as noted in HPI.  Physical Exam Updated Vital Signs BP 139/89   Pulse 70   Temp 97.9 F (36.6  C) (Oral)   Resp 18   SpO2 100%   Physical Exam Vitals and nursing note reviewed.  Constitutional:      General: He is not in acute distress.    Appearance: He is well-developed.  HENT:     Head: Normocephalic and atraumatic.  Eyes:     General:        Right eye: No discharge.        Left eye: No discharge.     Conjunctiva/sclera: Conjunctivae normal.  Cardiovascular:     Rate and Rhythm: Normal rate and regular rhythm.     Heart sounds: Normal heart sounds. No murmur. No friction rub. No gallop.   Pulmonary:     Effort: Pulmonary effort is normal. No respiratory distress.       Breath sounds: Normal breath sounds.  Abdominal:     General: There is no distension.     Palpations: Abdomen is soft.     Tenderness: There is no abdominal tenderness.  Musculoskeletal:        General: No tenderness.     Cervical back: Neck supple.     Right lower leg: No edema.     Left lower leg: No edema.  Skin:    General: Skin is warm and dry.  Neurological:     Mental Status: He is alert.  Psychiatric:        Behavior: Behavior normal.        Thought Content: Thought content normal.    ED Results / Procedures / Treatments   Labs (all labs ordered are listed, but only abnormal results are displayed) Labs Reviewed - No data to display  EKG None  Radiology DG Chest [facility] 1 View  Result Date: 09/28/2019 CLINICAL DATA:  49 year old male with shortness of breath and left shoulder pain. EXAM: PORTABLE CHEST 1 VIEW COMPARISON:  Chest radiograph report dated 04/15/2003. The images are not available for direct comparison. FINDINGS: There is background of emphysema. No focal consolidation, pleural effusion, pneumothorax. Postsurgical changes of the right lung. The cardiac silhouette is within normal limits. No acute osseous pathology. IMPRESSION: No active disease. Electronically Signed   By: Elgie Collard M.D.   On: 09/28/2019 20:59   Procedures Procedures (including critical care time)  Medications Ordered in ED Medications  oxyCODONE-acetaminophen (PERCOCET/ROXICET) 5-325 MG per tablet 1 tablet (has no administration in time range)  ibuprofen (ADVIL) tablet 600 mg (has no administration in time range)   ED Course  I have reviewed the triage vital signs and the nursing notes.  Pertinent labs & imaging results that were available during my care of the patient were reviewed by me and considered in my medical decision making (see chart for details).  MDM Rules/Calculators/A&P  49 year old male with sharp pleuritic left-sided chest pain.  Breath sounds seem symmetric to  me.  Seems very atypical symptoms for ACS.  Consider pneumothorax, particularly with his past history.  He is in no obvious distress though.  O2 sats are normal.  We will treat his pain and obtain a chest x-ray.  Doubt ACS, PE, dissection.  Imaging fine. Doubt emergent process. PRN NSAIDs. Return precautions discussed.   Final Clinical Impression(s) / ED Diagnoses Final diagnoses:  Pleuritic chest pain    Rx / DC Orders ED Discharge Orders    None       Raeford Razor, MD 09/30/19 [phone]

## 2020-05-03 ENCOUNTER — Other Ambulatory Visit: Payer: Self-pay

## 2020-05-03 ENCOUNTER — Ambulatory Visit
Admission: EM | Admit: 2020-05-03 | Discharge: 2020-05-03 | Disposition: A | Discharge status: Home / Self Care | Payer: Managed Care, Other (non HMO) | Attending: Family Medicine | Admitting: Family Medicine

## 2020-05-03 ENCOUNTER — Encounter: Payer: Self-pay | Admitting: Emergency Medicine

## 2020-05-03 ENCOUNTER — Ambulatory Visit (INDEPENDENT_AMBULATORY_CARE_PROVIDER_SITE_OTHER): Payer: Managed Care, Other (non HMO)

## 2020-05-03 DIAGNOSIS — Z1152 Encounter for screening for COVID-19: Secondary | ICD-10-CM | POA: Diagnosis not present

## 2020-05-03 DIAGNOSIS — R059 Cough, unspecified: Secondary | ICD-10-CM

## 2020-05-03 DIAGNOSIS — R05 Cough: Secondary | ICD-10-CM | POA: Diagnosis not present

## 2020-05-03 DIAGNOSIS — R509 Fever, unspecified: Secondary | ICD-10-CM

## 2020-05-03 MED ORDER — AZITHROMYCIN 250 MG PO TABS: 250.0000 mg | ORAL_TABLET | Freq: Once | ORAL | 0 refills | Status: AC

## 2020-05-03 NOTE — ED Triage Notes (Signed)
Pt here for cough and body aches x 4 days 

## 2020-05-03 NOTE — ED Provider Notes (Signed)
[name]    CSN: [id number] Arrival date & time: 05/03/20  1056      History   Chief Complaint Chief Complaint  Patient presents with  . Cough  . Generalized Body Aches    HPI Nikolis Berent is a 49 y.o. male.   Patient has cough fever myalgias.  Has history of COPD pneumothoraces.  No known exposure to Covid but has not been immunized.  HPI  Past Medical History:  Diagnosis Date  . Asthma   . COPD (chronic obstructive pulmonary disease) (HCC)     There are no problems to display for this patient.   Past Surgical History:  Procedure Laterality Date  . PLEURAL SCARIFICATION    . right lobectomy         Home Medications    Prior to Admission medications   Medication Sig Start Date End Date Taking? Authorizing Provider  buPROPion (WELLBUTRIN SR) 150 MG 12 hr tablet Take 150 mg by mouth at bedtime.  11/19/18   [provider]  busPIRone (BUSPAR) 5 MG tablet Take 5 mg by mouth at bedtime.  11/30/18   [provider]  cephALEXin (KEFLEX) 500 MG capsule Take 1 capsule (500 mg total) by mouth 4 (four) times daily. Patient not taking: Reported on 09/28/2019 01/29/19   Petrucelli, Pleas Koch, PA-C  dicyclomine (BENTYL) 20 MG tablet Take 20 mg by mouth at bedtime.     [provider]  ibuprofen (ADVIL) 800 MG tablet Take 1 tablet (800 mg total) by mouth 3 (three) times daily. Patient taking differently: Take 800 mg by mouth every 6 (six) hours as needed for fever or moderate pain.  01/29/19   Petrucelli, Samantha R, PA-C  ondansetron (ZOFRAN ODT) 4 MG disintegrating tablet Take 1 tablet (4 mg total) by mouth every 8 (eight) hours as needed for nausea or vomiting. Patient not taking: Reported on 09/28/2019 01/29/19   Petrucelli, Pleas Koch, PA-C  oxyCODONE-acetaminophen (PERCOCET/ROXICET) 5-325 MG tablet Take 1-2 tablets by mouth every 6 (six) hours as needed for severe pain. Patient not taking: Reported on 09/28/2019 01/29/19   Petrucelli, Pleas Koch, PA-C  oxymetazoline (AFRIN) 0.05 % nasal spray Place 2 sprays into both nostrils as needed for congestion.     [provider]  SUMAtriptan (IMITREX) 100 MG tablet Take 100 mg by mouth every 2 (two) hours as needed for migraine or headache. Maximum dose 200mg  in 24 hours. 11/08/17   [provider]  tamsulosin (FLOMAX) 0.4 MG CAPS capsule Take 1 capsule (0.4 mg total) by mouth daily after supper. Patient not taking: Reported on 09/28/2019 01/29/19   Petrucelli, 03/31/19, PA-C  traZODone (DESYREL) 50 MG tablet Take 50 mg by mouth at bedtime. 11/19/18   [provider]    Family History History reviewed. No pertinent family history.  Social History Social History   Tobacco Use  . Smoking status: Former 11/21/18  . Smokeless tobacco: Current User  Substance Use Topics  . Alcohol use: Yes    Comment: rarely  . Drug use: Never     Allergies   Aspirin   Review of Systems Review of Systems  Constitutional: Positive for fever.  Respiratory: Positive for cough.   Musculoskeletal: Positive for myalgias.  All other systems reviewed and are negative.    Physical Exam Triage Vital Signs ED Triage Vitals [05/03/20 1126]  Enc Vitals Group     BP 115/80     Pulse Rate (!) 104  Resp 18     Temp 99.5 F (37.5 C)     Temp Source Oral     SpO2 98 %     Weight      Height      Head Circumference      Peak Flow      Pain Score 6     Pain Loc      Pain Edu?      Excl. in GC?    No data found.  Updated Vital Signs BP 115/80 (BP Location: Right Arm)   Pulse (!) 104   Temp 99.5 F (37.5 C) (Oral)   Resp 18   SpO2 98%   Visual Acuity Right Eye Distance:   Left Eye Distance:   Bilateral Distance:    Right Eye Near:   Left Eye Near:    Bilateral Near:     Physical Exam Vitals and nursing note reviewed.  Constitutional:      Appearance: Normal appearance.  HENT:     Head: Normocephalic.     Right Ear: Tympanic membrane normal.     Left  Ear: Tympanic membrane normal.     Mouth/Throat:     Mouth: Mucous membranes are moist.  Cardiovascular:     Rate and Rhythm: Normal rate.  Pulmonary:     Effort: Pulmonary effort is normal.     Breath sounds: Normal breath sounds.  Abdominal:     General: Abdomen is flat. Bowel sounds are normal.     Palpations: Abdomen is soft.  Neurological:     Mental Status: He is alert.      UC Treatments / Results  Labs (all labs ordered are listed, but only abnormal results are displayed) Labs Reviewed  NOVEL CORONAVIRUS, NAA    EKG   Radiology No results found.  Procedures Procedures (including critical care time)  Medications Ordered in UC Medications - No data to display  Initial Impression / Assessment and Plan / UC Course  I have reviewed the triage vital signs and the nursing notes.  Pertinent labs & imaging results that were available during my care of the patient were reviewed by me and considered in my medical decision making (see chart for details).     Cough and fever, rule out Covid.  Chest x-ray shows some linear densities consistent with infection but radiologist has recommended follow-up CT Final Clinical Impressions(s) / UC Diagnoses   Final diagnoses:  Encounter for screening for COVID-19   Discharge Instructions   None    ED Prescriptions    None     PDMP not reviewed this encounter.   Frederica Kuster, MD 05/03/20 1159

## 2020-05-03 NOTE — Discharge Instructions (Addendum)
Follow up with pulmonology

## 2020-05-05 LAB — NOVEL CORONAVIRUS, NAA: SARS-CoV-2, NAA: NOT DETECTED

## 2020-05-05 LAB — SARS-COV-2, NAA 2 DAY TAT

## 2020-06-10 ENCOUNTER — Encounter (HOSPITAL_COMMUNITY): Payer: Self-pay

## 2020-06-10 ENCOUNTER — Other Ambulatory Visit: Payer: Self-pay

## 2020-06-10 ENCOUNTER — Ambulatory Visit (HOSPITAL_COMMUNITY): Payer: Managed Care, Other (non HMO)

## 2020-06-10 ENCOUNTER — Ambulatory Visit (INDEPENDENT_AMBULATORY_CARE_PROVIDER_SITE_OTHER): Payer: Managed Care, Other (non HMO)

## 2020-06-10 ENCOUNTER — Ambulatory Visit
Admission: EM | Admit: 2020-06-10 | Discharge: 2020-06-10 | Disposition: A | Discharge status: Home / Self Care | Payer: Managed Care, Other (non HMO) | Attending: Physician Assistant | Admitting: Physician Assistant

## 2020-06-10 ENCOUNTER — Ambulatory Visit (HOSPITAL_COMMUNITY)
Admission: EM | Admit: 2020-06-10 | Discharge: 2020-06-10 | Discharge status: Against Medical Advice | Payer: Managed Care, Other (non HMO) | Attending: Family Medicine | Admitting: Family Medicine

## 2020-06-10 DIAGNOSIS — R0781 Pleurodynia: Secondary | ICD-10-CM

## 2020-06-10 DIAGNOSIS — R0789 Other chest pain: Secondary | ICD-10-CM

## 2020-06-10 DIAGNOSIS — R0602 Shortness of breath: Secondary | ICD-10-CM | POA: Diagnosis not present

## 2020-06-10 MED ORDER — TRAMADOL HCL 50 MG PO TABS
50.0000 mg | ORAL_TABLET | Freq: Two times a day (BID) | ORAL | 0 refills | Status: DC | PRN
Start: 2020-06-10 — End: 2020-09-15

## 2020-06-10 NOTE — ED Triage Notes (Signed)
Pt states he is having right side pain in his lung area. Pain began this morning. Pt is aox4 and ambulatory.

## 2020-06-10 NOTE — ED Provider Notes (Signed)
Emergency Department Provider Note  ____________________________________________  Time seen: Approximately 3:11 PM  I have reviewed the triage vital signs and the nursing notes.   HISTORY  Chief Complaint Flank Pain (possible lung pain)   Historian Patient    HPI Bobby Brown is a 49 y.o. male presents to the emergency department with right-sided pleuritic chest pain.  Patient states that he has a history of spontaneous pneumothoraces.  He has experienced some mild shortness of breath without cough, fever, chills, nasal congestion or headache.  He denies swelling of the lower extremities.  He has had a prior blebectomy and pleurodesis   Past Medical History:  Diagnosis Date  . Asthma   . COPD (chronic obstructive pulmonary disease) (HCC)      Immunizations up to date:  Yes.     Past Medical History:  Diagnosis Date  . Asthma   . COPD (chronic obstructive pulmonary disease) (HCC)     There are no problems to display for this patient.   Past Surgical History:  Procedure Laterality Date  . PLEURAL SCARIFICATION    . right lobectomy      Prior to Admission medications   Medication Sig Start Date End Date Taking? Authorizing Provider  buPROPion (WELLBUTRIN SR) 150 MG 12 hr tablet Take 150 mg by mouth at bedtime.  11/19/18  Yes [provider]  busPIRone (BUSPAR) 5 MG tablet Take 5 mg by mouth at bedtime.  11/30/18  Yes [provider]  ibuprofen (ADVIL) 800 MG tablet Take 1 tablet (800 mg total) by mouth 3 (three) times daily. Patient taking differently: Take 800 mg by mouth every 6 (six) hours as needed for fever or moderate pain.  01/29/19  Yes Petrucelli, Samantha R, PA-C  oxymetazoline (AFRIN) 0.05 % nasal spray Place 2 sprays into both nostrils as needed for congestion.    Yes [provider]  traZODone (DESYREL) 50 MG tablet Take 50 mg by mouth at bedtime. 11/19/18  Yes [provider]  cephALEXin (KEFLEX) 500 MG capsule Take  1 capsule (500 mg total) by mouth 4 (four) times daily. Patient not taking: Reported on 09/28/2019 01/29/19   Petrucelli, Pleas Koch, PA-C  dicyclomine (BENTYL) 20 MG tablet Take 20 mg by mouth at bedtime.     [provider]  ondansetron (ZOFRAN ODT) 4 MG disintegrating tablet Take 1 tablet (4 mg total) by mouth every 8 (eight) hours as needed for nausea or vomiting. Patient not taking: Reported on 09/28/2019 01/29/19   Petrucelli, Pleas Koch, PA-C  oxyCODONE-acetaminophen (PERCOCET/ROXICET) 5-325 MG tablet Take 1-2 tablets by mouth every 6 (six) hours as needed for severe pain. Patient not taking: Reported on 09/28/2019 01/29/19   Petrucelli, Pleas Koch, PA-C  SUMAtriptan (IMITREX) 100 MG tablet Take 100 mg by mouth every 2 (two) hours as needed for migraine or headache. Maximum dose 200mg  in 24 hours. 11/08/17   [provider]  tamsulosin (FLOMAX) 0.4 MG CAPS capsule Take 1 capsule (0.4 mg total) by mouth daily after supper. Patient not taking: Reported on 09/28/2019 01/29/19   Petrucelli, 03/31/19 R, PA-C    Allergies Aspirin  History reviewed. No pertinent family history.  Social History Social History   Tobacco Use  . Smoking status: Former Lelon Mast  . Smokeless tobacco: Current User  Vaping Use  . Vaping Use: Never used  Substance Use Topics  . Alcohol use: Yes    Comment: rarely  . Drug use: Never     Review of Systems  Constitutional: No  fever/chills Eyes:  No discharge ENT: No upper respiratory complaints. Respiratory: no cough. No SOB/ use of accessory muscles to breath Cardiovascular: Patient has pleuritic chest pain. Gastrointestinal:   No nausea, no vomiting.  No diarrhea.  No constipation. Musculoskeletal: Negative for musculoskeletal pain. Skin: Negative for rash, abrasions, lacerations, ecchymosis.    ____________________________________________   PHYSICAL EXAM:  VITAL SIGNS: ED Triage Vitals [06/10/20 1305]  Enc Vitals Group     BP 131/89      Pulse Rate 89     Resp 17     Temp 98.1 F (36.7 C)     Temp Source Oral     SpO2 100 %     Weight      Height      Head Circumference      Peak Flow      Pain Score 8     Pain Loc      Pain Edu?      Excl. in GC?      Constitutional: Alert and oriented. Well appearing and in no acute distress. Eyes: Conjunctivae are normal. PERRL. EOMI. Head: Atraumatic. ENT:      Nose: No congestion/rhinnorhea.      Mouth/Throat: Mucous membranes are moist.  Neck: No stridor.  No cervical spine tenderness to palpation. Cardiovascular: Normal rate, regular rhythm. Normal S1 and S2.  Good peripheral circulation. Respiratory: Normal respiratory effort without tachypnea or retractions. Lungs CTAB. Good air entry to the bases with no decreased or absent breath sounds Gastrointestinal: Bowel sounds x 4 quadrants. Soft and nontender to palpation. No guarding or rigidity. No distention. Musculoskeletal: Full range of motion to all extremities. No obvious deformities noted Neurologic:  Normal for age. No gross focal neurologic deficits are appreciated.  Skin:  Skin is warm, dry and intact. No rash noted. Psychiatric: Mood and affect are normal for age. Speech and behavior are normal.   ____________________________________________   LABS (all labs ordered are listed, but only abnormal results are displayed)  Labs Reviewed - No data to display ____________________________________________  EKG   ____________________________________________  RADIOLOGY   No results found.  ____________________________________________    PROCEDURES  Procedure(s) performed:     Procedures     Medications - No data to display   ____________________________________________   INITIAL IMPRESSION / ASSESSMENT AND PLAN / ED COURSE  Pertinent labs & imaging results that were available during my care of the patient were reviewed by me and considered in my medical decision making (see chart for  details).      Assessment and Plan:  Pleuritic chest pain:  49 year old male presents to the emergency department with right-sided pleuritic chest pain.   Patient eloped from urgent care before care plan can take place.    ____________________________________________  FINAL CLINICAL IMPRESSION(S) / ED DIAGNOSES  Final diagnoses:  None      NEW MEDICATIONS STARTED DURING THIS VISIT:  ED Discharge Orders    None          This chart was dictated using voice recognition software/Dragon. Despite best efforts to proofread, errors can occur which can change the meaning. Any change was purely unintentional.     Orvil Feil, PA-C 06/10/20 1519

## 2020-06-10 NOTE — ED Notes (Signed)
Pt did not answered. Pt was called and left a voice message.

## 2020-06-10 NOTE — Discharge Instructions (Addendum)
Your chest x-ray is stable. Continue ibuprofen 800mg  three times a day. Tylenol 1000mg  three times a day. Tramadol as needed. Follow up with PCP if symptoms not improving. If sudden worsening of chest pain/shortness of breath, go to the emergency department for further evaluation.

## 2020-06-10 NOTE — ED Notes (Signed)
Patient was called x 1 for placement in treatment room.

## 2020-06-10 NOTE — ED Provider Notes (Signed)
[name]    CSN: [id number] Arrival date & time: 06/10/20  1608      History   Chief Complaint Chief Complaint  Patient presents with  . Back Pain    HPI Bobby Brown is a 49 y.o. male.   49 year old male with history of asthma/COPD, spontaneous pneumothorax s/p right blebectomy/pleurodesis comes in for right pleuritic back/chest pain, shortness of breath. States since blebectomy/pleurodesis, can feel tearing sensation, this is usually treated with ibuprofen. However, no improvement of symptoms after ibuprofen today. Denies injury/trauma. Has baseline shortness of breath from COPD/asthma, and has had worsening symptoms depending on positioning. Denies URI symptoms, fever. Denies lower extremity swelling.     Past Medical History:  Diagnosis Date  . Asthma   . COPD (chronic obstructive pulmonary disease) (HCC)     There are no problems to display for this patient.   Past Surgical History:  Procedure Laterality Date  . PLEURAL SCARIFICATION    . right lobectomy         Home Medications    Prior to Admission medications   Medication Sig Start Date End Date Taking? Authorizing Provider  buPROPion (WELLBUTRIN SR) 150 MG 12 hr tablet Take 150 mg by mouth at bedtime.  11/19/18   [provider]  busPIRone (BUSPAR) 5 MG tablet Take 5 mg by mouth at bedtime.  11/30/18   [provider]  ibuprofen (ADVIL) 800 MG tablet Take 1 tablet (800 mg total) by mouth 3 (three) times daily. Patient taking differently: Take 800 mg by mouth every 6 (six) hours as needed for fever or moderate pain.  01/29/19   Petrucelli, Samantha R, PA-C  oxymetazoline (AFRIN) 0.05 % nasal spray Place 2 sprays into both nostrils as needed for congestion.     [provider]  SUMAtriptan (IMITREX) 100 MG tablet Take 100 mg by mouth every 2 (two) hours as needed for migraine or headache. Maximum dose 200mg  in 24 hours. 11/08/17   [provider]  traMADol  (ULTRAM) 50 MG tablet Take 1 tablet (50 mg total) by mouth every 12 (twelve) hours as needed. 06/10/20   06/12/20, Okechukwu Regnier V, PA-C  traZODone (DESYREL) 50 MG tablet Take 50 mg by mouth at bedtime. 11/19/18   [provider]    Family History History reviewed. No pertinent family history.  Social History Social History   Tobacco Use  . Smoking status: Former 11/21/18  . Smokeless tobacco: Current User  Vaping Use  . Vaping Use: Never used  Substance Use Topics  . Alcohol use: Yes    Comment: rarely  . Drug use: Never     Allergies   Aspirin   Review of Systems Review of Systems  Reason unable to perform ROS: See HPI as above.     Physical Exam Triage Vital Signs ED Triage Vitals  Enc Vitals Group     BP 06/10/20 1705 117/85     Pulse Rate 06/10/20 1705 80     Resp 06/10/20 1705 16     Temp 06/10/20 1705 98 F (36.7 C)     Temp Source 06/10/20 1705 Oral     SpO2 06/10/20 1705 97 %     Weight --      Height --      Head Circumference --      Peak Flow --      Pain Score 06/10/20 1715 7     Pain Loc --      Pain Edu? --  Excl. in GC? --    No data found.  Updated Vital Signs BP 117/85 (BP Location: Left Arm)   Pulse 80   Temp 98 F (36.7 C) (Oral)   Resp 16   SpO2 97%   Physical Exam Constitutional:      General: He is not in acute distress.    Appearance: Normal appearance. He is well-developed. He is not toxic-appearing or diaphoretic.  HENT:     Head: Normocephalic and atraumatic.  Eyes:     Conjunctiva/sclera: Conjunctivae normal.     Pupils: Pupils are equal, round, and reactive to light.  Cardiovascular:     Rate and Rhythm: Normal rate and regular rhythm.     Heart sounds: Normal heart sounds. No murmur heard.  No friction rub. No gallop.   Pulmonary:     Effort: Pulmonary effort is normal. No respiratory distress.     Comments: LCTAB Musculoskeletal:     Cervical back: Normal range of motion and neck supple.  Skin:    General: Skin  is warm and dry.  Neurological:     Mental Status: He is alert and oriented to person, place, and time.      UC Treatments / Results  Labs (all labs ordered are listed, but only abnormal results are displayed) Labs Reviewed - No data to display  EKG   Radiology DG Chest 2 View  Result Date: 06/10/2020 CLINICAL DATA:  49 year old male with shortness of breath, pleuritic chest pain. History of spontaneous pneumothoraces. EXAM: CHEST - 2 VIEW COMPARISON:  Chest radiographs 05/03/2020, 09/28/2019. FINDINGS: Chronic pulmonary hyperinflation. Mediastinal contours remain within normal limits. Chronic staple line and increased density in the peripheral right upper lobe, stable from last month. Staple line also at the peripheral right lung base. Chronic bilateral increased pulmonary interstitial markings. No pneumothorax, pulmonary edema, dependent pleural fluid, or acute pulmonary opacity identified. Stable visualized osseous structures. Mild chronic midthoracic compression fracture. Negative visible bowel gas pattern. IMPRESSION: 1. Chronic lung disease with hyperinflation and stable multifocal postoperative change to the right lung. 2.  No acute cardiopulmonary abnormality identified. Electronically Signed   By: Odessa Fleming M.D.   On: 06/10/2020 18:05    Procedures Procedures (including critical care time)  Medications Ordered in UC Medications - No data to display  Initial Impression / Assessment and Plan / UC Course  I have reviewed the triage vital signs and the nursing notes.  Pertinent labs & imaging results that were available during my care of the patient were reviewed by me and considered in my medical decision making (see chart for details).    Discussed CXR results with patient. Will continue symptomatic management and monitor closely. Strict return precautions given. Otherwise follow up with PCP for recheck/monitoring.  Final Clinical Impressions(s) / UC Diagnoses   Final  diagnoses:  Pleuritic pain    ED Prescriptions    Medication Sig Dispense Auth. Provider   traMADol (ULTRAM) 50 MG tablet Take 1 tablet (50 mg total) by mouth every 12 (twelve) hours as needed. 10 tablet Belinda Fisher, PA-C     I have reviewed the PDMP during this encounter.   Belinda Fisher, PA-C 06/10/20 2223

## 2020-06-10 NOTE — ED Triage Notes (Signed)
Pt states has bleb dx and at 1045 this morning he felt a bleb tear. Pt c/o pain under rt shoulder blade.

## 2020-07-02 IMAGING — DX LEFT FOREARM - 2 VIEW
2 series · 2 of 2 positions shown · non-contrast
Comparison: Left wrist radiograph dated 11/02/2013

CLINICAL DATA: 48-year-old male with laceration of the left
forearm.

EXAM:
LEFT FOREARM - 2 VIEW

[forearm ap]
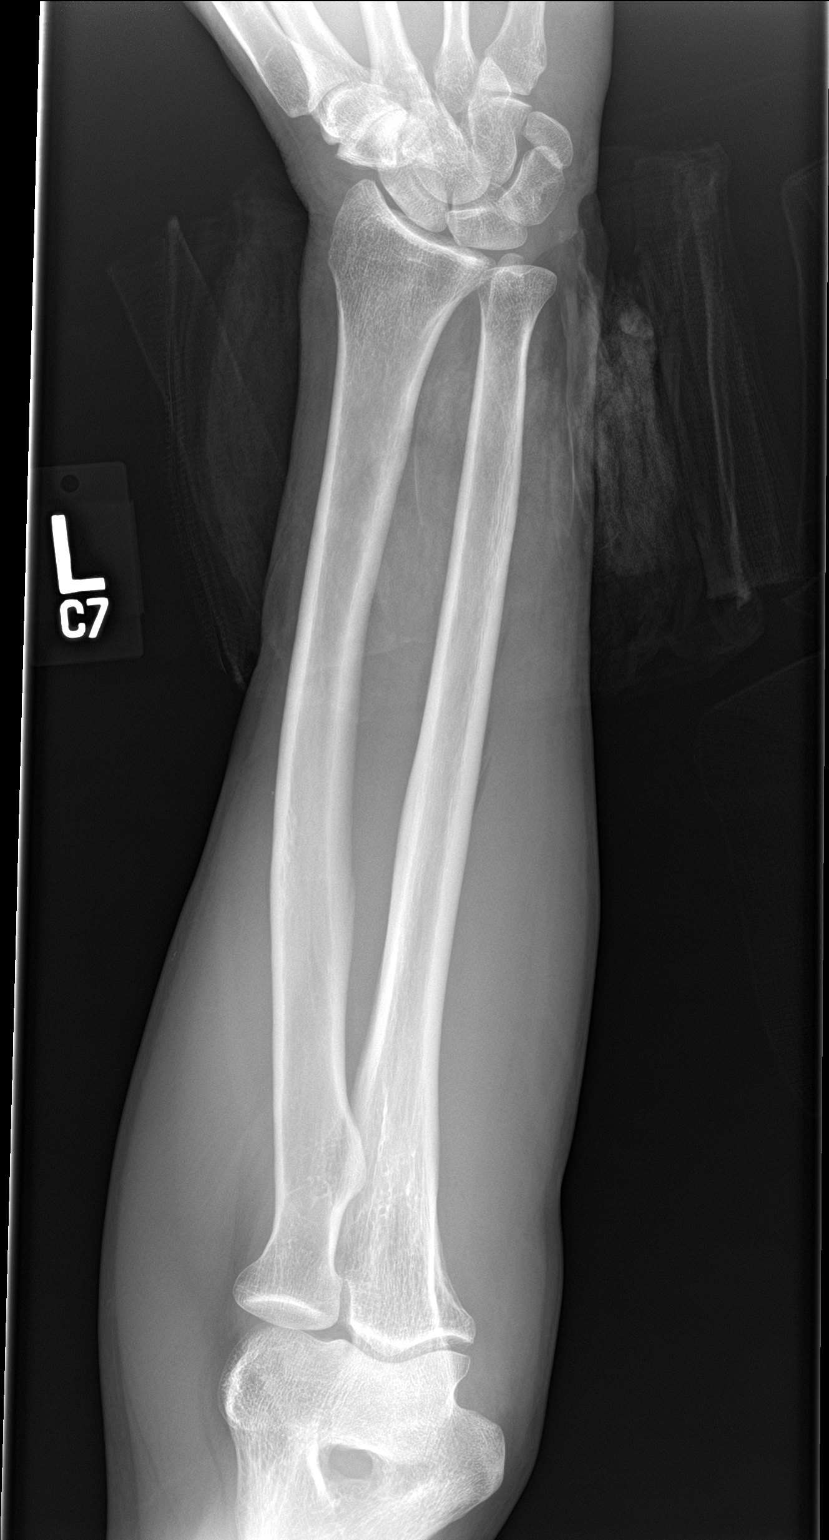

[forearm lat]
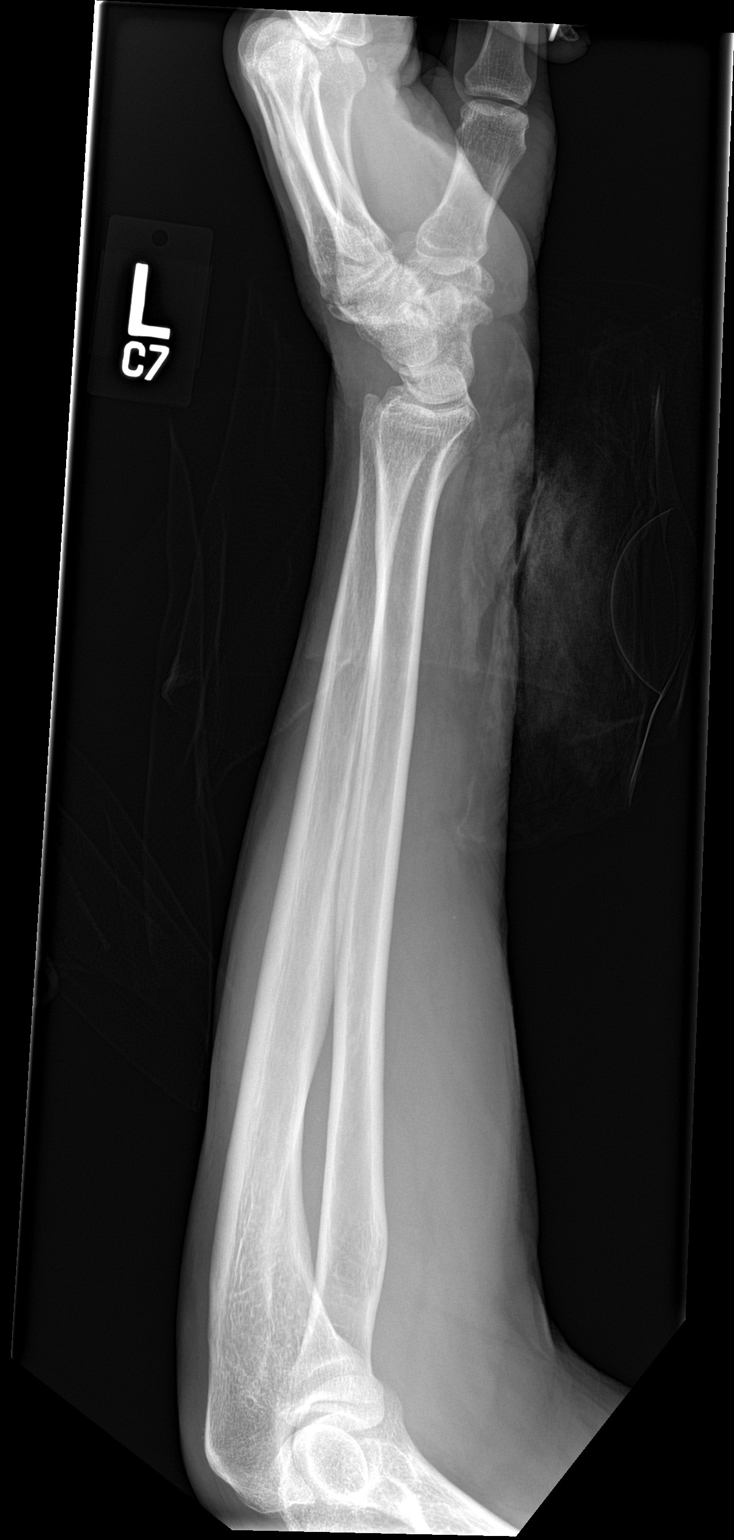

[2 of 2 positions shown; findings below may reference images not displayed]

FINDINGS: There is no acute fracture or dislocation. The bones are well
mineralized. Degenerative changes of the scaphotrapezoid. Laceration
of the volar aspect of the distal forearm. No radiopaque foreign
object.
IMPRESSION: 1. No acute/traumatic osseous pathology.
2. No radiopaque foreign body.

## 2020-09-02 ENCOUNTER — Institutional Professional Consult (permissible substitution): Payer: Managed Care, Other (non HMO) | Admitting: Emergency Medicine

## 2020-09-15 ENCOUNTER — Other Ambulatory Visit: Payer: Self-pay

## 2020-09-15 ENCOUNTER — Ambulatory Visit (INDEPENDENT_AMBULATORY_CARE_PROVIDER_SITE_OTHER): Payer: Managed Care, Other (non HMO)

## 2020-09-15 ENCOUNTER — Ambulatory Visit
Admission: EM | Admit: 2020-09-15 | Discharge: 2020-09-15 | Disposition: A | Discharge status: Home / Self Care | Payer: Managed Care, Other (non HMO) | Attending: Emergency Medicine | Admitting: Emergency Medicine

## 2020-09-15 DIAGNOSIS — M25432 Effusion, left wrist: Secondary | ICD-10-CM

## 2020-09-15 DIAGNOSIS — M25532 Pain in left wrist: Secondary | ICD-10-CM | POA: Diagnosis not present

## 2020-09-15 NOTE — ED Provider Notes (Signed)
[name]    CSN: [id number] Arrival date & time: 09/15/20  1733      History   Chief Complaint Chief Complaint  Patient presents with  . Wrist Pain    HPI Bobby Brown is a 50 y.o. male  History as below presenting for left wrist pain, swelling s/p pop sensation that he experiences during his driving wheel yesterday.  Denies impact injury, numbness or deformity.  Has been wearing wrist brace with some relief.  Past Medical History:  Diagnosis Date  . Asthma   . COPD (chronic obstructive pulmonary disease) (HCC)     There are no problems to display for this patient.   Past Surgical History:  Procedure Laterality Date  . PLEURAL SCARIFICATION    . right lobectomy         Home Medications    Prior to Admission medications   Medication Sig Start Date End Date Taking? Authorizing Provider  buPROPion (WELLBUTRIN SR) 150 MG 12 hr tablet Take 150 mg by mouth at bedtime.  11/19/18   [provider]  busPIRone (BUSPAR) 5 MG tablet Take 5 mg by mouth at bedtime.  11/30/18   [provider]  ibuprofen (ADVIL) 800 MG tablet Take 1 tablet (800 mg total) by mouth 3 (three) times daily. Patient taking differently: Take 800 mg by mouth every 6 (six) hours as needed for fever or moderate pain.  01/29/19   Petrucelli, Samantha R, PA-C  oxymetazoline (AFRIN) 0.05 % nasal spray Place 2 sprays into both nostrils as needed for congestion.     [provider]  SUMAtriptan (IMITREX) 100 MG tablet Take 100 mg by mouth every 2 (two) hours as needed for migraine or headache. Maximum dose 200mg  in 24 hours. 11/08/17   [provider]  traZODone (DESYREL) 50 MG tablet Take 50 mg by mouth at bedtime. 11/19/18   [provider]    Family History History reviewed. No pertinent family history.  Social History Social History   Tobacco Use  . Smoking status: Former 11/21/18  . Smokeless tobacco: Current User  Vaping Use  . Vaping Use:  Never used  Substance Use Topics  . Alcohol use: Yes    Comment: rarely  . Drug use: Never     Allergies   Aspirin   Review of Systems Review of Systems  Constitutional: Negative for fatigue and fever.  Respiratory: Negative for cough and shortness of breath.   Cardiovascular: Negative for chest pain and palpitations.  Gastrointestinal: Negative for abdominal pain, diarrhea and vomiting.  Musculoskeletal: Negative for arthralgias and myalgias.       For left wrist pain, swelling, decreased ROM.  Skin: Negative for rash and wound.  Neurological: Negative for speech difficulty and headaches.  All other systems reviewed and are negative.    Physical Exam Triage Vital Signs ED Triage Vitals  Enc Vitals Group     BP 09/15/20 1858 105/69     Pulse Rate 09/15/20 1858 81     Resp 09/15/20 1858 16     Temp 09/15/20 1858 97.8 F (36.6 C)     Temp Source 09/15/20 1858 Oral     SpO2 09/15/20 1858 98 %     Weight --      Height --      Head Circumference --      Peak Flow --      Pain Score 09/15/20 1910 4     Pain Loc --  Pain Edu? --      Excl. in GC? --    No data found.  Updated Vital Signs BP 105/69   Pulse 81   Temp 97.8 F (36.6 C) (Oral)   Resp 16   SpO2 98%   Visual Acuity Right Eye Distance:   Left Eye Distance:   Bilateral Distance:    Right Eye Near:   Left Eye Near:    Bilateral Near:     Physical Exam Constitutional:      General: He is not in acute distress. HENT:     Head: Normocephalic and atraumatic.  Eyes:     General: No scleral icterus.    Pupils: Pupils are equal, round, and reactive to light.  Cardiovascular:     Rate and Rhythm: Normal rate.  Pulmonary:     Effort: Pulmonary effort is normal. No respiratory distress.     Breath sounds: No wheezing.  Musculoskeletal:        General: Swelling and tenderness present. No deformity.     Comments: First with diffuse joint pain, swelling.  No crepitus, bony deformity.  NVI   Skin:    Capillary Refill: Capillary refill takes less than 2 seconds.     Coloration: Skin is not jaundiced or pale.     Findings: No bruising or rash.  Neurological:     General: No focal deficit present.     Mental Status: He is alert and oriented to person, place, and time.      UC Treatments / Results  Labs (all labs ordered are listed, but only abnormal results are displayed) Labs Reviewed - No data to display  EKG   Radiology DG Wrist Complete Left  Result Date: 09/15/2020 CLINICAL DATA:  Popping sensation yesterday without acute trauma, initial encounter EXAM: LEFT WRIST - COMPLETE 3+ VIEW COMPARISON:  03/16/2019 FINDINGS: No acute fracture or dislocation is noted. Mild degenerative changes are noted at the first [facility] joint as well as at the articulation of the trapezium and scaphoid. No soft tissue abnormality is noted. IMPRESSION: Degenerative change without acute abnormality. Electronically Signed   By: Alcide Clever M.D.   On: 09/15/2020 19:31    Procedures Procedures (including critical care time)  Medications Ordered in UC Medications - No data to display  Initial Impression / Assessment and Plan / UC Course  I have reviewed the triage vital signs and the nursing notes.  Pertinent labs & imaging results that were available during my care of the patient were reviewed by me and considered in my medical decision making (see chart for details).     X-ray with degenerative change to the first [facility] joint as well as articulation of trapezium and scaphoid.  No evidence of fracture, effusion.  We will treat supportively as below, wear wrist brace, follow-up with Ortho in 1 week.  Return precautions discussed, pt verbalized understanding and is agreeable to plan. Final Clinical Impressions(s) / UC Diagnoses   Final diagnoses:  Pain and swelling of left wrist     Discharge Instructions     RICE: rest, ice, compression, elevation as needed for pain.    Pain  medication:  350 mg-1000 mg of Tylenol (acetaminophen) and/or 200 mg - 800 mg of Advil (ibuprofen, Motrin) every 8 hours as needed.  May alternate between the two throughout the day as they are generally safe to take together.  DO NOT exceed more than 3000 mg of Tylenol or 3200 mg of ibuprofen in a 24 hour  period as this could damage your stomach, kidneys, liver, or increase your bleeding risk.  Important to follow up with specialist(s) below for further evaluation/management if your symptoms persist or worsen.    ED Prescriptions    None     I have reviewed the PDMP during this encounter.   Hall[place], [place], [place] 09/15/20 2010

## 2020-09-15 NOTE — Discharge Instructions (Signed)

## 2020-09-15 NOTE — ED Triage Notes (Signed)
Pt states turning his steering wheel while driving yesterday and felt a pop to lt wrist. C/o pain and decrease movement.

## 2020-09-21 ENCOUNTER — Other Ambulatory Visit: Payer: Self-pay

## 2020-09-21 ENCOUNTER — Ambulatory Visit: Payer: Managed Care, Other (non HMO) | Admitting: Pulmonary Disease

## 2020-09-21 ENCOUNTER — Encounter: Payer: Self-pay | Admitting: Pulmonary Disease

## 2020-09-21 VITALS — BP 108/68 | HR 90 | Temp 97.3°F | Ht 69.0 in | Wt 139.2 lb

## 2020-09-21 DIAGNOSIS — J4540 Moderate persistent asthma, uncomplicated: Secondary | ICD-10-CM | POA: Diagnosis not present

## 2020-09-21 DIAGNOSIS — J9383 Other pneumothorax: Secondary | ICD-10-CM | POA: Diagnosis not present

## 2020-09-21 DIAGNOSIS — J439 Emphysema, unspecified: Secondary | ICD-10-CM | POA: Diagnosis not present

## 2020-09-21 DIAGNOSIS — R0600 Dyspnea, unspecified: Secondary | ICD-10-CM | POA: Diagnosis not present

## 2020-09-21 DIAGNOSIS — R0609 Other forms of dyspnea: Secondary | ICD-10-CM

## 2020-09-21 MED ORDER — BREZTRI AEROSPHERE 160-9-4.8 MCG/ACT IN AERO: 2.0000 | INHALATION_SPRAY | Freq: Two times a day (BID) | RESPIRATORY_TRACT | 11 refills | Status: DC

## 2020-09-21 NOTE — Patient Instructions (Signed)
Nice to meet you!  Use Breztri 2 puffs twice a day every day - this is a maintenance inhaler. Rinse your mouth out after every use  Continue albuterol as needed  We will get labs today  When you come back, we can discuss further work up, maybe repeat pictures or images  Return for follow up in 3 months with Dr. Judeth Horn or sooner as needed

## 2020-09-25 ENCOUNTER — Telehealth: Payer: Self-pay | Admitting: Internal Medicine

## 2020-09-25 NOTE — Telephone Encounter (Signed)
Called answsreing svc. Says started [facility] 09/25/2020 - first dose last ngith - > went to bed and did not know if had any AE because he also takes sleeping medciations. Took 2nd dose -> 10am -> 45 min later -> started trembling bad. Could not hold hands still. Right now also feels legs vibrating as well. Says hands slightly better but legs worse. BP/Pulse ox fine. Otherwise feels ok  He has never tried any other inhalers  Has used albuterol few times per week but no use after starting breztri.. never had this prob with labuterol  Plan  - stop breztri - list as allergy  - 09/26/20 triage desk / Dr Judeth Horn -> to come with altnerate plan - no specific Rx possible over phone  - other than to inhaler to wash out - if worse/not getting better/any concern -> go To ER

## 2020-09-26 NOTE — Telephone Encounter (Signed)
I have discontinued breztri from pt's med list and have added this to pt's allergy list.  Dr. Judeth Horn, please advise on an alternate inhaler for pt.

## 2020-09-29 LAB — CYSTIC FIBROSIS MUTATION 97: Interpretation: NOT DETECTED

## 2020-09-29 MED ORDER — FLUTICASONE PROPIONATE HFA 220 MCG/ACT IN AERO: 2.0000 | INHALATION_SPRAY | Freq: Two times a day (BID) | RESPIRATORY_TRACT | 12 refills | Status: AC

## 2020-09-29 NOTE — Telephone Encounter (Signed)
Attempted to call pt. Left message for pt to call back to discuss recommendations by Dr Judeth Horn.

## 2020-10-03 LAB — ALPHA-1 ANTITRYPSIN PHENOTYPE: A-1 Antitrypsin, Ser: 149 mg/dL (ref 83–199)

## 2021-01-14 IMAGING — DX DG CHEST 1V PORT
2 series · 2 of 2 positions shown · non-contrast
Comparison: Chest radiograph report dated 04/15/2003. The images
are not available for direct comparison.

CLINICAL DATA: 48-year-old male with shortness of breath and left
shoulder pain.

EXAM:
PORTABLE CHEST 1 VIEW

[chest ap (1 of 2)]
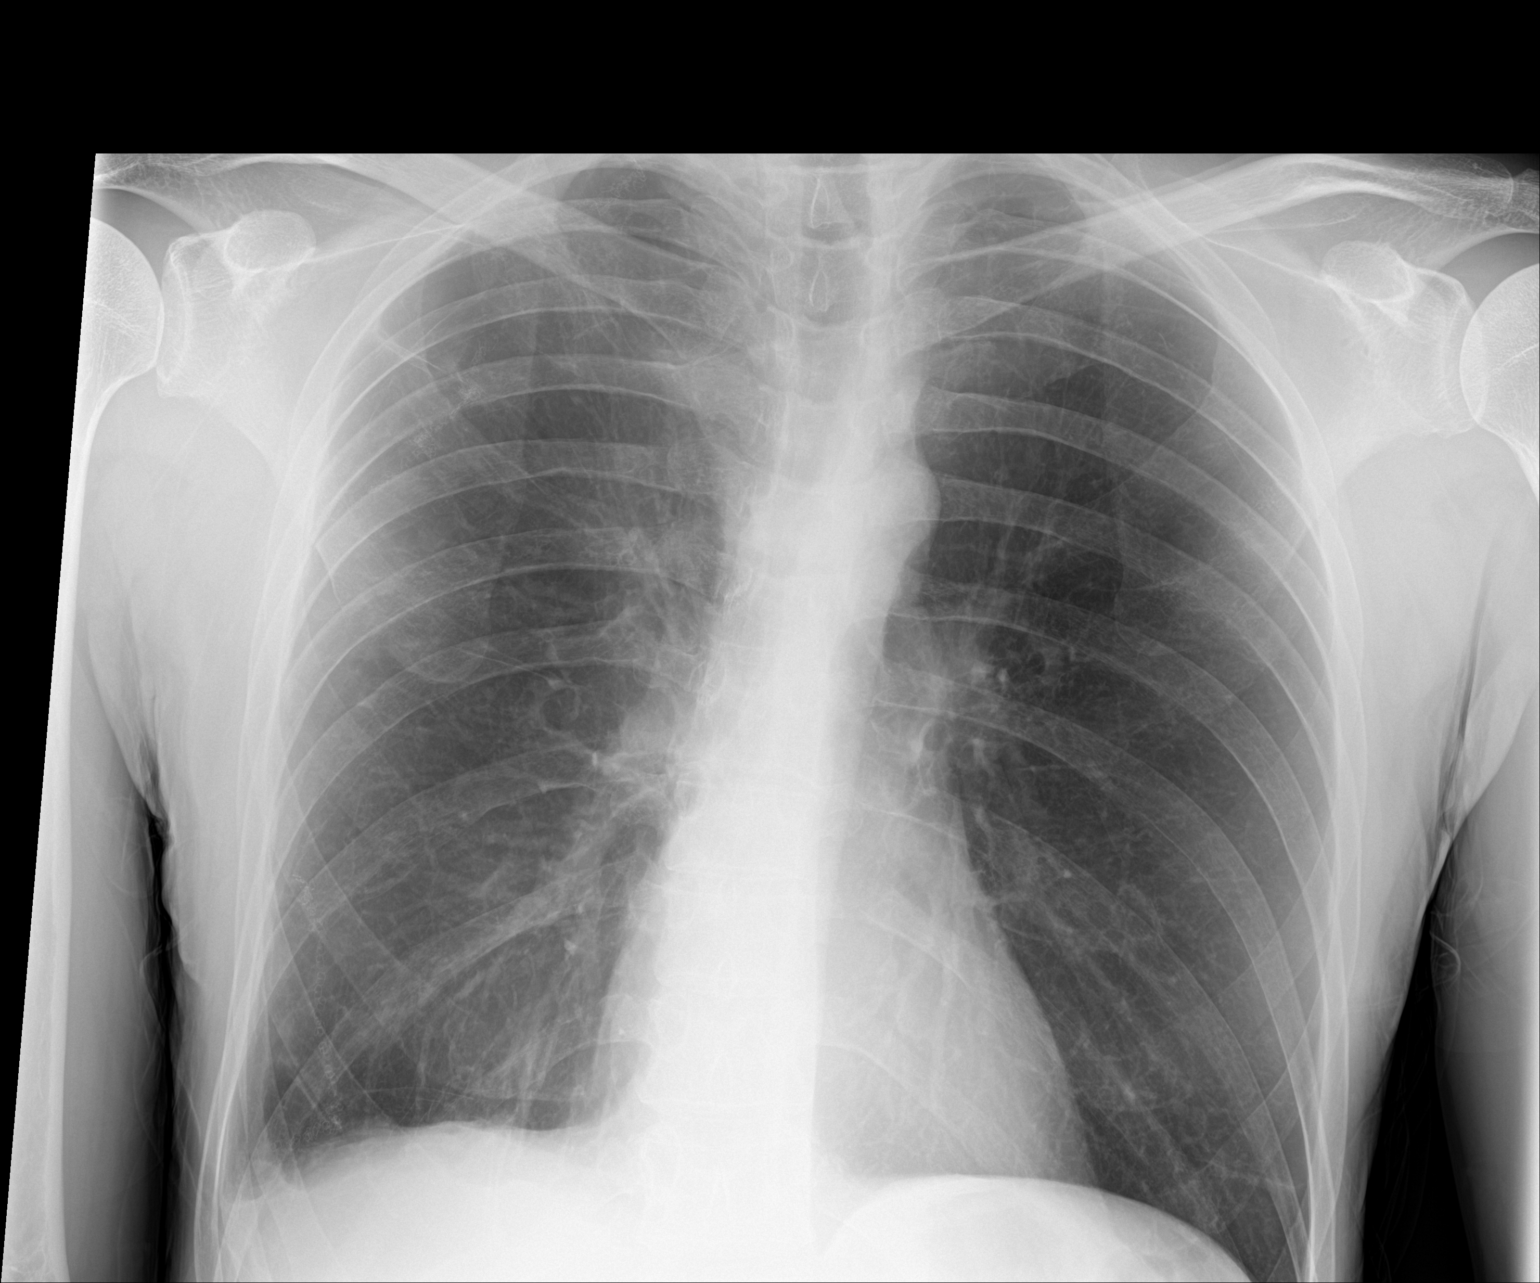

[chest ap (2 of 2)]
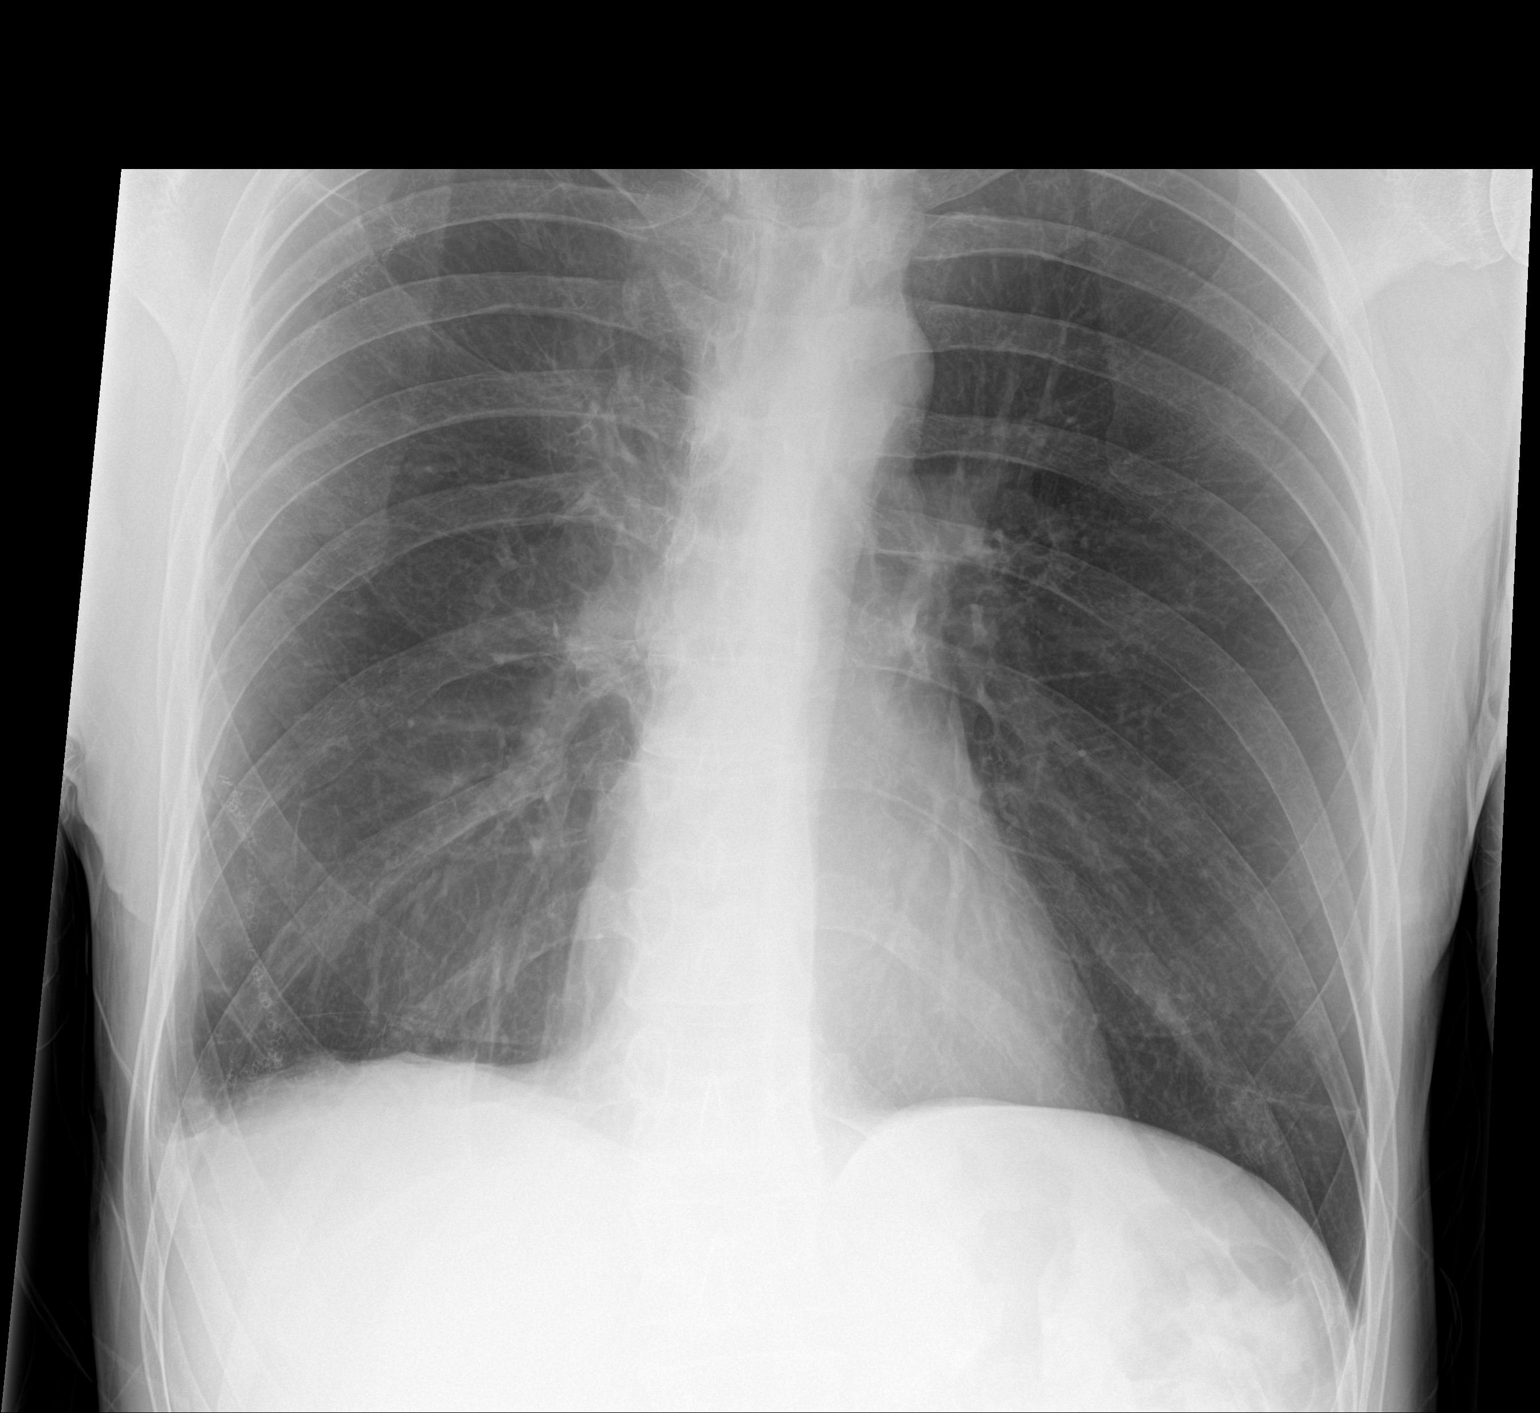

[2 of 2 positions shown; findings below may reference images not displayed]

FINDINGS: There is background of emphysema. No focal consolidation, pleural
effusion, pneumothorax. Postsurgical changes of the right lung. The
cardiac silhouette is within normal limits. No acute osseous
pathology.
IMPRESSION: No active disease.

## 2021-05-23 NOTE — Progress Notes (Signed)
@Patient  ID: , male    DOB: 03-Dec-1970, 50 y.o.   MRN: [id number]  Chief Complaint  Patient presents with   Consult    Asthma COPD     Referring provider: [id number], MD  HPI:   50 year old man whom we are seeing in consultation for evaluation of dyspnea, asthma.  PCP note reviewed.  Patient with longstanding history of asthma and breathing issues.  Recurrent bronchitis.  Start as a child.  Multiple times a year.  Is continued in adulthood.  Often worse in the winters.  Seems triggered by viral illnesses often.  Not currently using maintenance inhaler.  Does use albuterol.  Finds it to be helpful.  Reviewed most recent chest x-ray 08/2020 that on my interpretation reveals hyperinflation, otherwise clear lungs.  PMH: Asthma, recurrent bronchitis, migraines Surgical history: Right lobectomy, right pleurodesis Family history:History reviewed. No pertinent family history. Social history: Former smoker, approximate 09/2020, quit in 2019    Questionaires / Pulmonary Flowsheets:   ACT:  No flowsheet data found.  MMRC: No flowsheet data found.  Epworth:  No flowsheet data found.  Tests:   FENO:  No results found for: NITRICOXIDE  PFT: No flowsheet data found.  WALK:  No flowsheet data found.  Imaging: Personally reviewed as per EMR discussion this note  Lab Results: Personally reviewed CBC    Component Value Date/Time   WBC 5.1 01/29/2019 1002   RBC 4.71 01/29/2019 1002   HGB 12.5 (L) 01/29/2019 1002   HCT 39.5 01/29/2019 1002   PLT 191 01/29/2019 1002   MCV 83.9 01/29/2019 1002   MCH 26.5 01/29/2019 1002   MCHC 31.6 01/29/2019 1002   RDW 14.6 01/29/2019 1002    BMET    Component Value Date/Time   NA 139 01/29/2019 1002   K 3.5 01/29/2019 1002   CL 110 01/29/2019 1002   CO2 24 01/29/2019 1002   GLUCOSE 98 01/29/2019 1002   BUN 12 01/29/2019 1002   CREATININE 1.09 01/29/2019 1002   CALCIUM 9.3 01/29/2019 1002   GFRNONAA  >60 01/29/2019 1002   GFRAA >60 01/29/2019 1002    BNP No results found for: BNP  ProBNP No results found for: PROBNP  Specialty Problems   None     Immunization History  Administered Date(s) Administered   Influenza Split 05/20/2019   Influenza Whole 08/09/2020   Pneumococcal Polysaccharide-23 06/06/2018   Tdap 06/13/2018    Past Medical History:  Diagnosis Date   Asthma    COPD (chronic obstructive pulmonary disease) (HCC)     Tobacco History: Social History   Tobacco Use  Smoking Status Former   Packs/day: 0.75   Years: 25.00   Pack years: 18.75   Types: Cigarettes   Quit date: 2019   Years since quitting: 3.7  Smokeless Tobacco Current  Tobacco Comments   Pt vapes now    Ready to quit: Not Answered Counseling given: Not Answered Tobacco comments: Pt vapes now    Continue to not smoke  Outpatient Encounter Medications as of 09/21/2020  Medication Sig   Albuterol Sulfate (PROAIR RESPICLICK) 108 (90 Base) MCG/ACT AEPB Inhale into the lungs.   buPROPion (WELLBUTRIN SR) 150 MG 12 hr tablet Take 150 mg by mouth at bedtime.    busPIRone (BUSPAR) 5 MG tablet Take 5 mg by mouth at bedtime.    ferrous sulfate 325 (65 FE) MG tablet Take by mouth.   ibuprofen (ADVIL) 800 MG tablet Take 1 tablet (800 mg total) by  mouth 3 (three) times daily. (Patient taking differently: Take 800 mg by mouth every 6 (six) hours as needed for fever or moderate pain.)   oxymetazoline (AFRIN) 0.05 % nasal spray Place 2 sprays into both nostrils as needed for congestion.    SUMAtriptan (IMITREX) 100 MG tablet Take 100 mg by mouth every 2 (two) hours as needed for migraine or headache. Maximum dose 200mg  in 24 hours.   traZODone (DESYREL) 50 MG tablet Take 50 mg by mouth at bedtime.   [DISCONTINUED] Budeson-Glycopyrrol-Formoterol (BREZTRI AEROSPHERE) 160-9-4.8 MCG/ACT AERO Inhale 2 puffs into the lungs in the morning and at bedtime.   No facility-administered encounter medications on  file as of 09/21/2020.     Review of Systems  Review of Systems  Chest with exertion.  No orthopnea or PND.  Comprehensive review of systems otherwise negative. Physical Exam  BP 108/68 (BP Location: Right Arm, Cuff Size: Normal)   Pulse 90   Temp (!) 97.3 F (36.3 C)   Ht 5\' 9"  (1.753 m)   Wt 139 lb 3.2 oz (63.1 kg)   SpO2 98%   BMI 20.56 kg/m   Wt Readings from Last 5 Encounters:  09/21/20 139 lb 3.2 oz (63.1 kg)  03/16/19 142 lb (64.4 kg)    BMI Readings from Last 5 Encounters:  09/21/20 20.56 kg/m  03/16/19 20.97 kg/m     Physical Exam  General: In chair, no acute distress Eyes: EOMI, no icterus Neck: Supple, no JVP Pulmonary: Clear, distant, normal work of breathing Cardiovascular: Regular rate and rhythm, no murmurs Abdomen: Nondistended, bowel sounds present MSK: No synovitis, no joint effusion Neuro: Normal gait, no weakness Psych: Normal mood, full affect  Assessment & Plan:   Asthma: Atopic symptoms.  Prior diagnosis.  Not reliably using maintenance inhaler.  Escalated inhaler therapy to ICS/LABA/LAMA via Breztri.  Continue albuterol as needed.  Recurrent bronchitis, respiratory illness: Started as a child.  Recurrent throughout his adulthood.  Alpha-1 antitrypsin, CF panel sent today.   Return in about 3 months (around 12/20/2020).   03/18/19, MD 05/23/2021

## 2021-08-20 IMAGING — DX DG CHEST 2V
2 series · 2 of 2 positions shown · non-contrast
Comparison: 09/28/2019

CLINICAL DATA: Cough and fever.

EXAM:
CHEST - 2 VIEW

[chest pa]
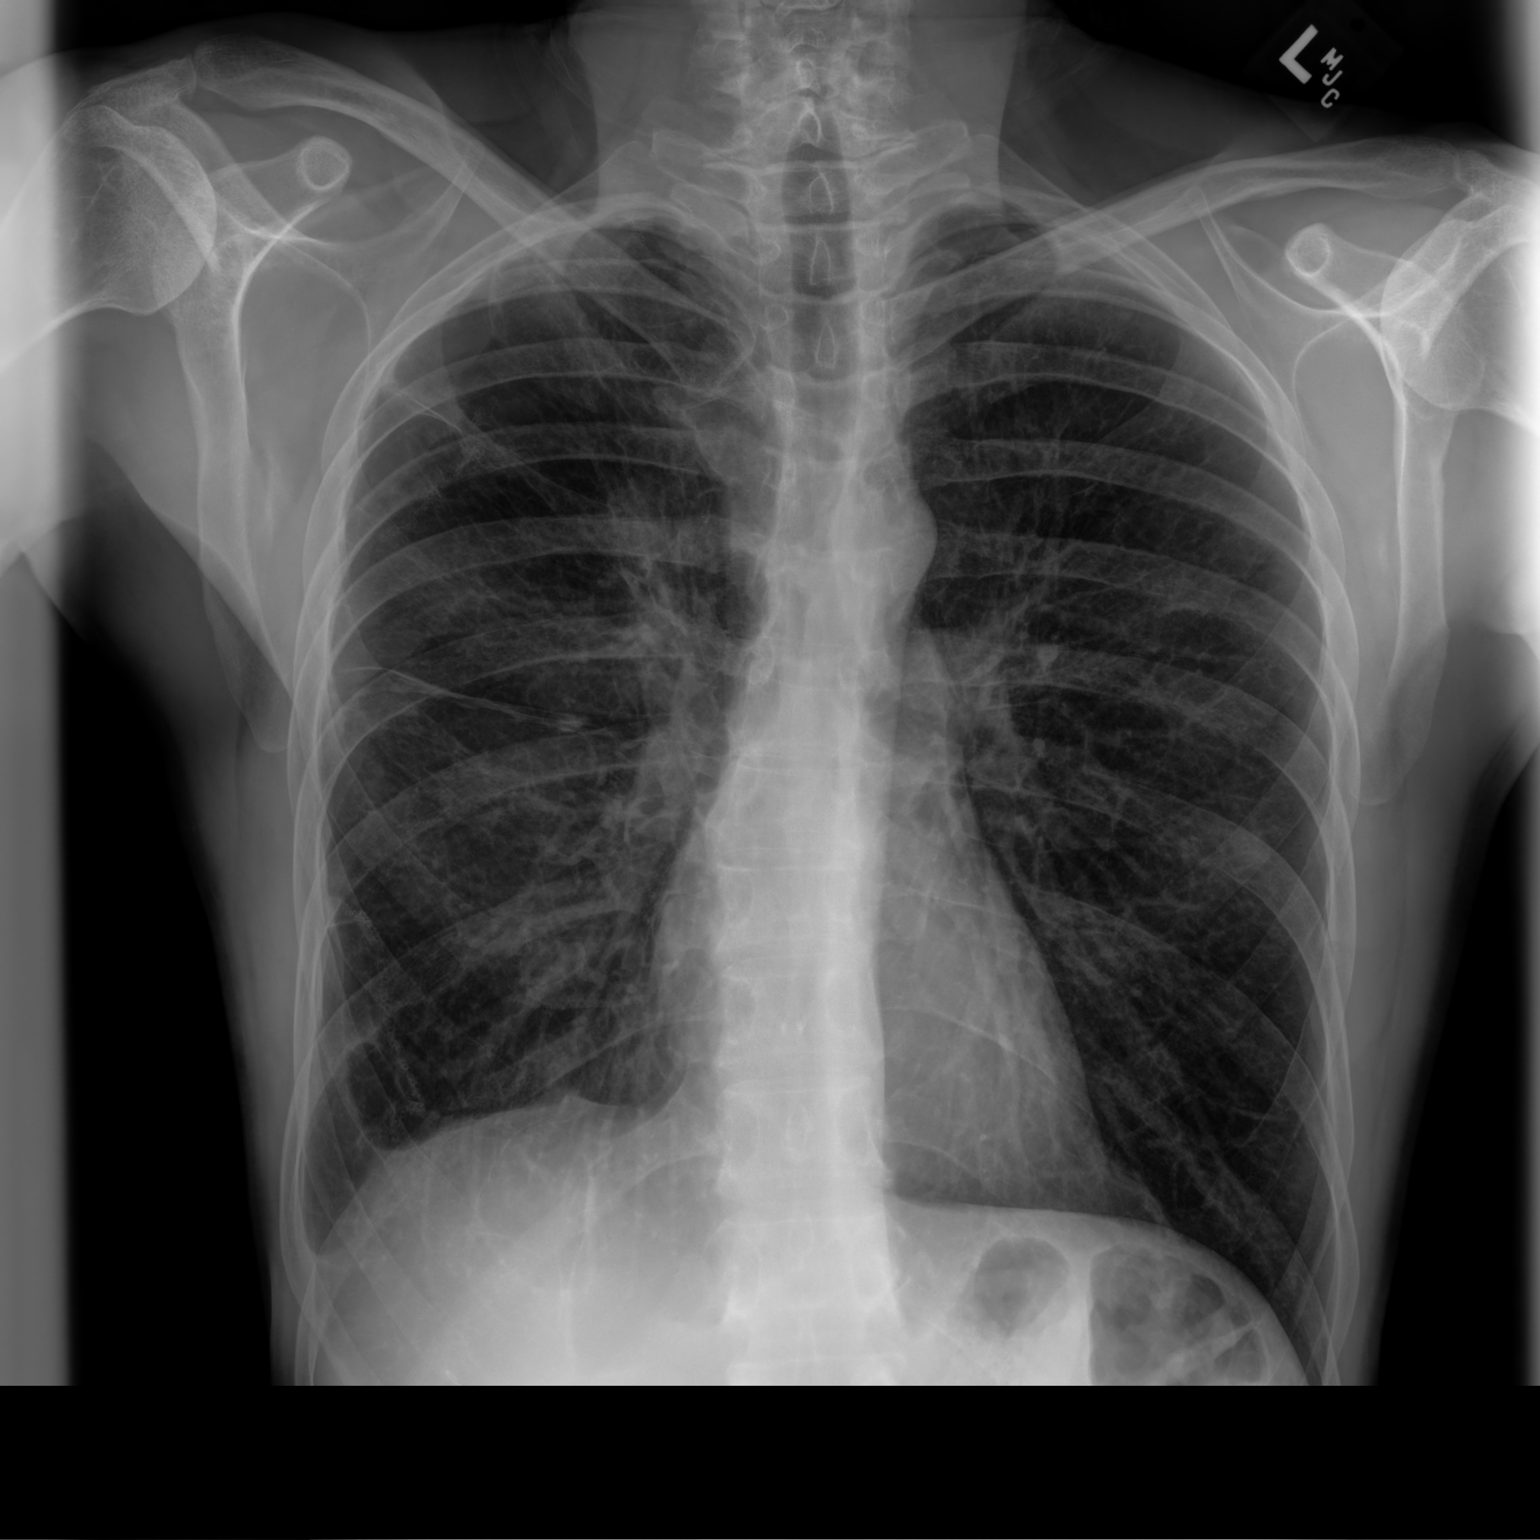

[chest lat]
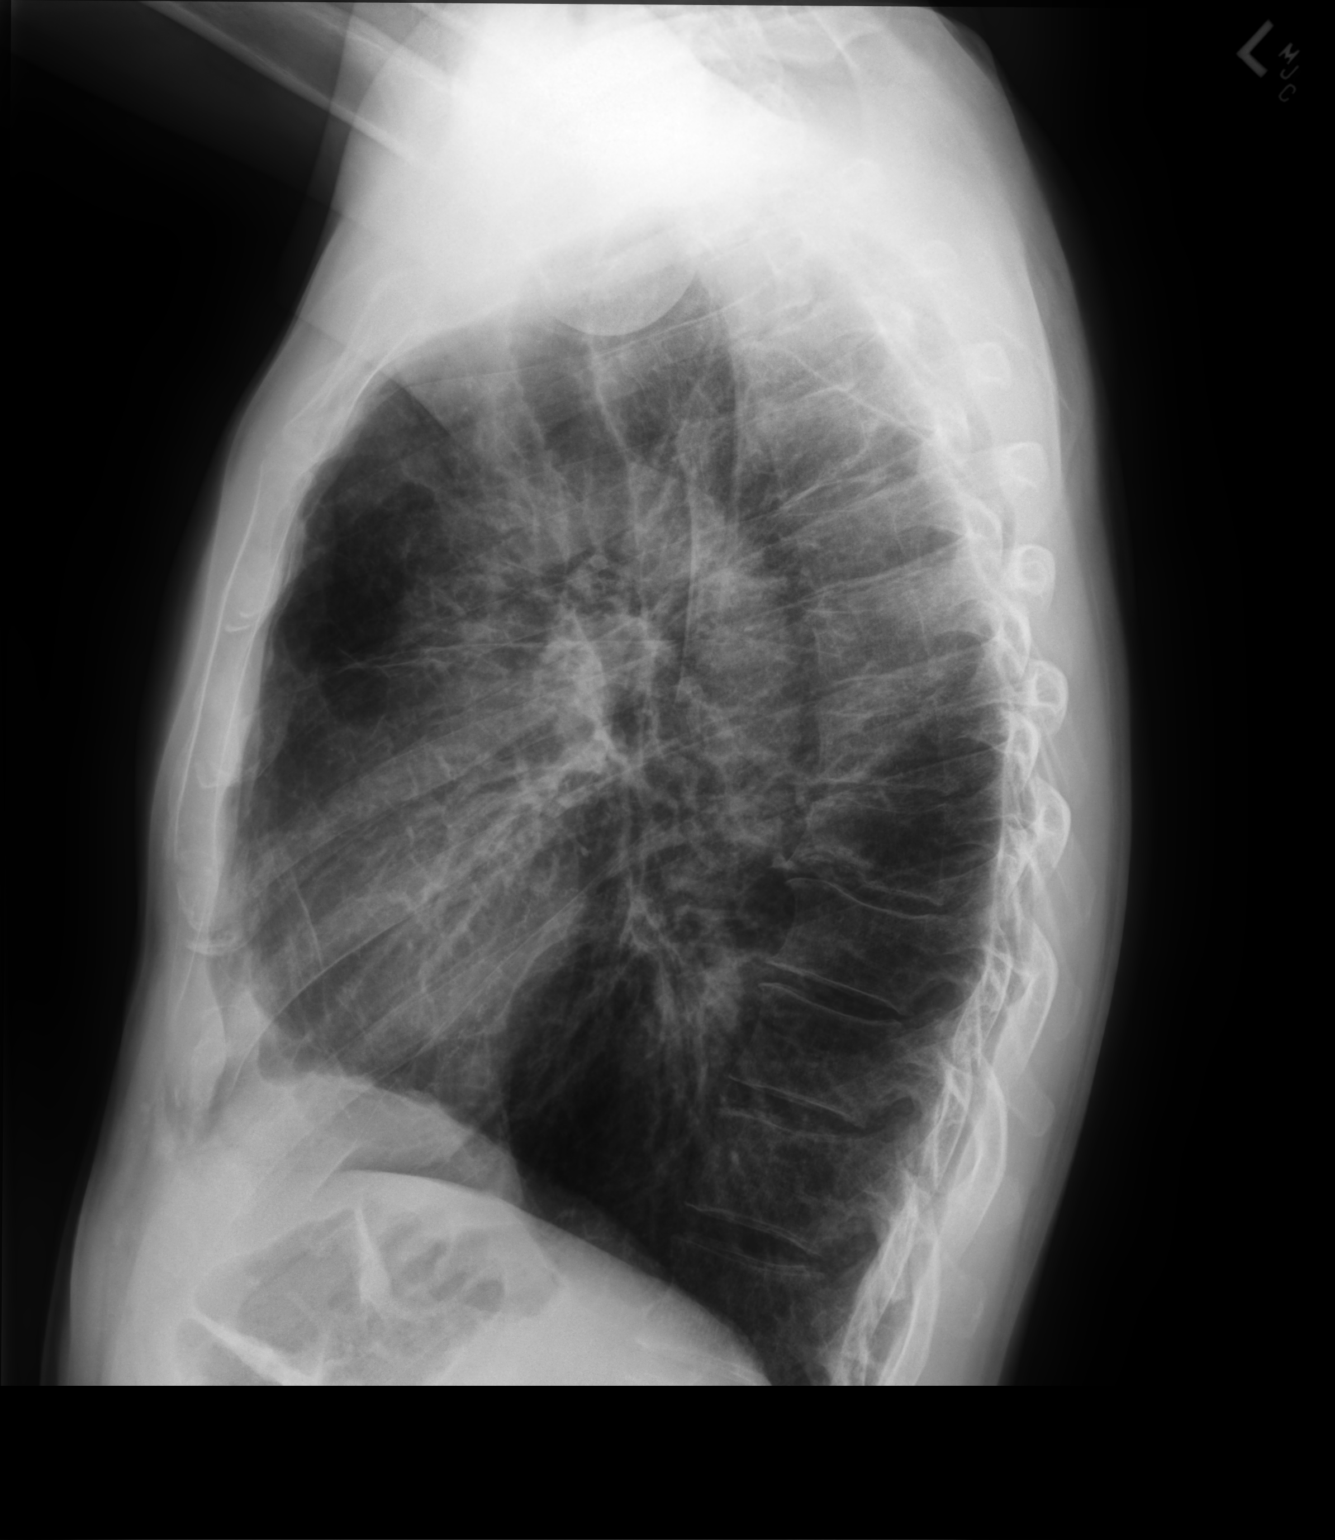

[2 of 2 positions shown; findings below may reference images not displayed]

FINDINGS: Again noted are surgical clips in the right lung. Volume loss in the
right hemithorax is compatible with postsurgical changes. Slightly
increased linear and interstitial densities in the right lung. There
is a questionable pleural-based density along the lateral right
upper chest and not clear if this was present on the previous
examination. No focal airspace disease or consolidation in left
lung. Heart and mediastinum are within normal limits and stable.
Age-indeterminate compression deformity in the midthoracic spine. No
large pleural effusions.
IMPRESSION: Increased interstitial and linear densities in the right lung.
Findings could be associated with atypical infection.

Questionable pleural-based density in the lateral right upper chest
region. This could be postsurgical but indeterminate. Consider
further evaluation of these radiographic findings with a chest CT.

Age indeterminate compression deformity in the thoracic spine.
